# Patient Record
Sex: Female | Born: 1955 | Race: White | Hispanic: No | Marital: Married | Smoking: Never smoker
Health system: Southern US, Community
[De-identification: ages and names within clinical notes are randomized; demographics above are authoritative.]

## PROBLEM LIST (undated history)

## (undated) DIAGNOSIS — R87619 Unspecified abnormal cytological findings in specimens from cervix uteri: Secondary | ICD-10-CM

## (undated) DIAGNOSIS — N3010 Interstitial cystitis (chronic) without hematuria: Secondary | ICD-10-CM

## (undated) DIAGNOSIS — N816 Rectocele: Secondary | ICD-10-CM

## (undated) DIAGNOSIS — IMO0002 Reserved for concepts with insufficient information to code with codable children: Secondary | ICD-10-CM

## (undated) DIAGNOSIS — N6019 Diffuse cystic mastopathy of unspecified breast: Secondary | ICD-10-CM

## (undated) DIAGNOSIS — B1920 Unspecified viral hepatitis C without hepatic coma: Secondary | ICD-10-CM

## (undated) DIAGNOSIS — G8929 Other chronic pain: Secondary | ICD-10-CM

## (undated) DIAGNOSIS — R011 Cardiac murmur, unspecified: Secondary | ICD-10-CM

## (undated) DIAGNOSIS — Z79899 Other long term (current) drug therapy: Secondary | ICD-10-CM

## (undated) DIAGNOSIS — G4700 Insomnia, unspecified: Secondary | ICD-10-CM

## (undated) HISTORY — PX: KNEE ARTHROSCOPY: SUR90

## (undated) HISTORY — DX: Other long term (current) drug therapy: Z79.899

## (undated) HISTORY — PX: NASAL POLYP SURGERY: SHX186

## (undated) HISTORY — DX: Cardiac murmur, unspecified: R01.1

## (undated) HISTORY — DX: Other chronic pain: G89.29

## (undated) HISTORY — PX: NASAL SEPTUM SURGERY: SHX37

## (undated) HISTORY — PX: BREAST ENHANCEMENT SURGERY: SHX7

## (undated) HISTORY — DX: Unspecified viral hepatitis C without hepatic coma: B19.20

## (undated) HISTORY — DX: Interstitial cystitis (chronic) without hematuria: N30.10

## (undated) HISTORY — DX: Reserved for concepts with insufficient information to code with codable children: IMO0002

## (undated) HISTORY — DX: Unspecified abnormal cytological findings in specimens from cervix uteri: R87.619

## (undated) HISTORY — PX: OTHER SURGICAL HISTORY: SHX169

## (undated) HISTORY — PX: ABDOMINOPLASTY: SUR9

## (undated) HISTORY — PX: CYSTOSCOPY: SUR368

## (undated) HISTORY — DX: Insomnia, unspecified: G47.00

## (undated) HISTORY — PX: ROTATOR CUFF REPAIR: SHX139

## (undated) HISTORY — DX: Rectocele: N81.6

## (undated) HISTORY — DX: Diffuse cystic mastopathy of unspecified breast: N60.19

---
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
## 1991-04-18 HISTORY — PX: OTHER SURGICAL HISTORY: SHX169

## 1998-11-23 ENCOUNTER — Other Ambulatory Visit: Admission: RE | Admit: 1998-11-23 | Discharge: 1998-11-23 | Payer: Self-pay | Admitting: *Deleted

## 2000-04-12 ENCOUNTER — Other Ambulatory Visit: Admission: RE | Admit: 2000-04-12 | Discharge: 2000-04-12 | Payer: Self-pay | Admitting: *Deleted

## 2001-02-02 ENCOUNTER — Other Ambulatory Visit: Admission: RE | Admit: 2001-02-02 | Discharge: 2001-02-02 | Payer: Self-pay | Admitting: *Deleted

## 2001-02-27 ENCOUNTER — Encounter: Payer: Self-pay | Admitting: Family Medicine

## 2001-02-27 ENCOUNTER — Encounter: Admission: RE | Admit: 2001-02-27 | Discharge: 2001-02-27 | Payer: Self-pay | Admitting: Family Medicine

## 2002-02-18 ENCOUNTER — Other Ambulatory Visit: Admission: RE | Admit: 2002-02-18 | Discharge: 2002-02-18 | Payer: Self-pay | Admitting: *Deleted

## 2003-03-13 ENCOUNTER — Other Ambulatory Visit: Admission: RE | Admit: 2003-03-13 | Discharge: 2003-03-13 | Payer: Self-pay | Admitting: *Deleted

## 2003-05-09 ENCOUNTER — Encounter: Payer: Self-pay | Admitting: Family Medicine

## 2003-05-09 ENCOUNTER — Encounter: Admission: RE | Admit: 2003-05-09 | Discharge: 2003-05-09 | Payer: Self-pay | Admitting: Family Medicine

## 2003-07-01 ENCOUNTER — Encounter: Admission: RE | Admit: 2003-07-01 | Discharge: 2003-07-01 | Payer: Self-pay | Admitting: Family Medicine

## 2003-11-04 ENCOUNTER — Ambulatory Visit (HOSPITAL_COMMUNITY): Admission: RE | Admit: 2003-11-04 | Discharge: 2003-11-04 | Payer: Self-pay | Admitting: Urology

## 2003-11-04 ENCOUNTER — Ambulatory Visit (HOSPITAL_BASED_OUTPATIENT_CLINIC_OR_DEPARTMENT_OTHER): Admission: RE | Admit: 2003-11-04 | Discharge: 2003-11-04 | Payer: Self-pay | Admitting: Urology

## 2004-03-15 ENCOUNTER — Other Ambulatory Visit: Admission: RE | Admit: 2004-03-15 | Discharge: 2004-03-15 | Payer: Self-pay | Admitting: *Deleted

## 2004-07-09 ENCOUNTER — Ambulatory Visit (HOSPITAL_BASED_OUTPATIENT_CLINIC_OR_DEPARTMENT_OTHER): Admission: RE | Admit: 2004-07-09 | Discharge: 2004-07-09 | Payer: Self-pay | Admitting: Orthopedic Surgery

## 2005-03-24 ENCOUNTER — Other Ambulatory Visit: Admission: RE | Admit: 2005-03-24 | Discharge: 2005-03-24 | Payer: Self-pay | Admitting: *Deleted

## 2006-03-28 ENCOUNTER — Other Ambulatory Visit: Admission: RE | Admit: 2006-03-28 | Discharge: 2006-03-28 | Payer: Self-pay | Admitting: Obstetrics & Gynecology

## 2006-07-25 ENCOUNTER — Ambulatory Visit (HOSPITAL_BASED_OUTPATIENT_CLINIC_OR_DEPARTMENT_OTHER): Admission: RE | Admit: 2006-07-25 | Discharge: 2006-07-25 | Payer: Self-pay | Admitting: Urology

## 2007-06-06 ENCOUNTER — Encounter: Admission: RE | Admit: 2007-06-06 | Discharge: 2007-06-06 | Payer: Self-pay | Admitting: Family Medicine

## 2007-08-16 ENCOUNTER — Encounter: Admission: RE | Admit: 2007-08-16 | Discharge: 2007-08-16 | Payer: Self-pay | Admitting: Family Medicine

## 2007-09-03 ENCOUNTER — Encounter: Admission: RE | Admit: 2007-09-03 | Discharge: 2007-09-03 | Payer: Self-pay | Admitting: Neurological Surgery

## 2007-09-03 ENCOUNTER — Other Ambulatory Visit: Admission: RE | Admit: 2007-09-03 | Discharge: 2007-09-03 | Payer: Self-pay | Admitting: Obstetrics and Gynecology

## 2007-12-25 ENCOUNTER — Encounter: Admission: RE | Admit: 2007-12-25 | Discharge: 2007-12-25 | Payer: Self-pay | Admitting: Neurological Surgery

## 2007-12-28 ENCOUNTER — Emergency Department (HOSPITAL_BASED_OUTPATIENT_CLINIC_OR_DEPARTMENT_OTHER): Admission: EM | Admit: 2007-12-28 | Discharge: 2007-12-28 | Payer: Self-pay | Admitting: Emergency Medicine

## 2008-09-04 ENCOUNTER — Other Ambulatory Visit: Admission: RE | Admit: 2008-09-04 | Discharge: 2008-09-04 | Payer: Self-pay | Admitting: Obstetrics & Gynecology

## 2008-10-15 ENCOUNTER — Emergency Department (HOSPITAL_BASED_OUTPATIENT_CLINIC_OR_DEPARTMENT_OTHER): Admission: EM | Admit: 2008-10-15 | Discharge: 2008-10-15 | Payer: Self-pay | Admitting: Emergency Medicine

## 2009-04-01 ENCOUNTER — Emergency Department (HOSPITAL_BASED_OUTPATIENT_CLINIC_OR_DEPARTMENT_OTHER): Admission: EM | Admit: 2009-04-01 | Discharge: 2009-04-01 | Payer: Self-pay | Admitting: Emergency Medicine

## 2010-03-18 HISTORY — PX: FRACTURE SURGERY: SHX138

## 2010-12-03 NOTE — Op Note (Signed)
NAME:  Alicia Fernandez, Alicia Fernandez                      ACCOUNT NO.:  [id number]   MEDICAL RECORD NO.:  [id number]                   PATIENT TYPE:  AMB   LOCATION:  [facility]                                 FACILITY:  [facility]   PHYSICIAN:  Jamison Neighbor, M.D.               DATE OF BIRTH:  1955/07/30   DATE OF PROCEDURE:  11/04/2003  DATE OF DISCHARGE:                                 OPERATIVE REPORT   PREOPERATIVE DIAGNOSIS:  Chronic pelvic pain, interstitial cystitis.   POSTOPERATIVE DIAGNOSIS:  Chronic pelvic pain, interstitial cystitis.   PROCEDURE:  Cystoscopy, urethral calibration, hydrodistention of bladder  marking and __________ Marcaine and Kenalog injection.   SURGEON:  Jamison Neighbor, M.D.   ANESTHESIA:  General.   COMPLICATIONS:  None.   DRAINS:  None.   BRIEF HISTORY:  This 55 year old female has had longstanding problems with  lower urinary tract symptomatology, including urgency, frequency, and pelvic  pain.  She has also exhibited evidence of dysfunctional voiding.  The  patient has been treated in the past with interstitial cystitis-type  therapy, but her therapy has been complicated by the fact that she is known  to have hepatitis and cannot take medications that effect her liver.  The  patient has currently been on Flomax and Valium along with some Pyridium  Plus and has also done DMSO bladder installation.  She has developed  increasing problems recently.  It is unclear if this is due to stress or due  to seasonal allergies, but she is now to the point where she is straining to  urinate, struggling to empty, and has had a significant increase in pain.  The patient was given several options to therapy, including a change in her  insulation therapy program, and she has elected to undergo cystoscopy with  hydrodistention.  She understands the risks and benefits of the procedure.  She simply understands that there is no guarantee that she will have the  same response to  hydrodistention that she has had in the past.  When she had  this done several years ago, she had good long-term therapy, and it is our  hope that this will allow her to be functional over the next 2-3 months when  her son is graduating from college and her daughter is getting married.   PROCEDURE:  After successful induction of general anesthesia, the patient  was placed in the lateral decubitus position and prepped with Betadine and  draped in the usual sterile fashion.  Careful bimanual examination was  unremarkable.  There were no urethral masses.  There were no adnexal masses.  There was no discharge of any kind.  There was modest cystocele.  No  significant rectocele.  No vault prolapse.  The urethra was calibrated at [address]  [place] with no evidence of stenosis or stricture.  The cystoscope was  inserted.  The bladder was carefully inspected.  It was free of any tumors  or stones.  Both ureteral orifices were normal in configuration and  location.  Clear urine was seen to efflux from each.  The patient underwent  hydrodistention at a pressure of 100 cm of water for 5 minutes.  When the  bladder was drained, the patient was found to have an essentially normal  bladder capacity of 1150 cc.  Modest glomerulations could be seen but  certainly not widespread bleeding or Hunter's ulcer formation.  If her  previous  history was not well known, this would appear to be an almost-normal  bladder.  There was nothing that required biopsy.  Marcaine and Pyridium  were left in the bladder.  Marcaine and Kenalog were injected  periurethrally.  The patient tolerated the procedure well and was taken to  the recovery room in good condition.                                               Jamison Neighbor, M.D.    RJE/MEDQ  D:  11/04/2003  T:  11/04/2003  Job:  [id number]   cc:   Donia Guiles, M.D.  [address] [place]  [place] [address]  Fax: [phone]

## 2010-12-03 NOTE — Op Note (Signed)
Alicia Fernandez, Alicia Fernandez            ACCOUNT NO.:  [id number]   MEDICAL RECORD NO.:  [id number]          PATIENT TYPE:  AMB   LOCATION:  [facility]                          FACILITY:  [facility]   PHYSICIAN:  Harvie Junior, M.D.   DATE OF BIRTH:  [birth year]   DATE OF PROCEDURE:  DATE OF DISCHARGE:                                 OPERATIVE REPORT   DATE OF OPERATION:  July 09, 2004.   PREOPERATIVE DIAGNOSES:  1.  Impingement, right shoulder.  2.  AC joint arthritis, right shoulder.  3.  Rotator cuff tear, right shoulder.   POSTOPERATIVE DIAGNOSES:  1.  Impingement, right shoulder.  2.  AC joint arthritis, right shoulder.  3.  Rotator cuff tear, right shoulder.   PRINCIPAL PROCEDURE:  1.  Mini open rotator cuff repair.  2.  Arthroscopic acromioplasty.  3.  Arthroscopic distal clavicle resection.   SURGEON:  Harvie Junior, MD.   ASSISTANT:  Marshia Ly, PA.   ANESTHESIA:  General.   BRIEF HISTORY:  A 55 year old female with a long history of having right  shoulder pain.  She was evaluated and underwent injection therapy, although  has continued complaints of pain.  MRI showed a high-grade partial tear.  We  talked about treatment options and felt that arthroscopic evaluation was the  most appropriate course of action, and she is brought to the operating room  for this procedure.   PROCEDURE:  The patient is brought to the operating room and after adequate  anesthesia was obtained with general anesthetic, the patient was placed into  the beach-chair position.  All bony prominences were well padded.  A  preoperative interscalene block had been performed.  Attention was turned to  the right shoulder, which was prepped and draped in the usual sterile  fashion.  Following this, routine arthroscopic examination of the shoulder  revealed that there was an obvious undersurface significant tear in the  glenohumeral joint.  This was debrided with the suction shaver, and a full  thickness tear was obvious.  At this point, attention was turned to the  superior labrum.  There was a little posterior, superior labral tear, which  was debrided.  There was a central area of osteocondylar defect within the  glenohumeral joint, which was debrided.  At this point, attention was turned  out of the glenohumeral joint and up into the subacromial space.  An  anterolateral acromioplasty was performed from the lateral and posterior  compartments, and a distal clavicle resection of 17 mm was performed at the  anterior compartment.  Attention was then turned towards the rotator cuff  where the obvious rotator cuff tear was identified.  At this point, the  arthroscopic portion of the case was stopped, a small incision made over the  lateral aspect of the shoulder and subcutaneous tissues down to the deltoid.  The deltoid was divided in line with its fibers, and the rotator cuff tear  was identified and debrided.  The bone was then freshened, and a single 5 mm  suture anchor was used with two #2 FiberWire sutures, and the rotator cuff  was repaired to this central area.  At this point, the shoulder was  copiously irrigated and suctioned dry.  A check was made for any tendency  towards impingement.  Finding none, the shoulder was copiously irrigated and  suctioned dry, and the deltoid was then closed with a #1 Vicryl running  suture, the skin with 3-0 Vicryl and a 4-0 Maxon pull-out suture.  Benzoin  and Steri-Strips were applied.  The portals were closed with a bandage.  A  sterile, compressive dressing was applied.  The patient was taken to the  recovery room where she was noted to be in satisfactory condition.  Estimated blood loss for the procedure was none.      Junita Push  D:  07/09/2004  T:  07/10/2004  Job:  [id number]

## 2010-12-03 NOTE — Op Note (Signed)
NAMEDAWNN, NAM            ACCOUNT NO.:  [id number]   MEDICAL RECORD NO.:  [id number]          PATIENT TYPE:  AMB   LOCATION:  [facility]                         [facility]ACILITY:  [facility]   PHYSICIAN:  Jamison Neighbor, M.D.  DATE OF BIRTH:  1955/12/20   DATE OF PROCEDURE:  07/25/2006  DATE OF DISCHARGE:                               OPERATIVE REPORT   PREOPERATIVE DIAGNOSIS:  Interstitial cystitis.   POSTOPERATIVE DIAGNOSIS:  Interstitial cystitis.   PROCEDURE:  Cystoscopy, urethral calibration, hydrodistention of the  bladder, Marcaine and Pyridium installation, Marcaine and Kenalog  injection.   SURGEON:  Dr. Marcelyn Bruins.   ANESTHESIA:  General.   COMPLICATIONS:  None.   DRAINS:  None.   BRIEF HISTORY:  This 55 year old female has a history of voiding  dysfunction secondary to pelvic floor problems.  She has also had  problems with pelvic pain responsive to hydrodistention.  The patient  has a relatively unremarkable bladder but seems to respond quite well to  hydrodistention.  She has requested a repeat hydrodistention be  performed.  She understands the risks and benefits of the procedure and  gave full informed consent.   DESCRIPTION OF PROCEDURE:  After successful induction of general  anesthesia, the patient was placed in the dorsal position, prepped with  Betadine and draped in the usual sterile fashion.  Careful bimanual  examination showed a modest cystocele, no vault prolapse, normal urethra  and no significant rectocele.  The urethra was calibrated to 30-[place]  with female urethral sounds with no evidence of stenosis or stricture.  The cystoscope was inserted, the bladder was carefully inspected and was  free of any tumor or stones.  Both ureteral orifices were normal in  configuration and location.  Hydrodistention of the bladder was  performed, the bladder was distended at a pressure of 100 cmH2O for 5  minutes. When the bladder was drained, the patient had  minimal  glomerulations and a normal bladder capacity of 1400 mL indicating her  bladder was in much better shape than it has been in the past. The  bladder was drained and a mixture of Marcaine and Pyridium was left in  the bladder, Marcaine and Kenalog  were injected periurethrally.  The patient tolerated the procedure well  and was taken to the recovery room in good condition.  She will continue  with her pelvic floor therapy.  She will have Cipro to take for  infections when needed and she will continue on pain management.           ______________________________  Jamison Neighbor, M.D.  Electronically Signed     RJE/MEDQ  D:  07/25/2006  T:  07/25/2006  Job:  [id number]

## 2012-02-16 DIAGNOSIS — B182 Chronic viral hepatitis C: Secondary | ICD-10-CM | POA: Insufficient documentation

## 2012-05-16 ENCOUNTER — Ambulatory Visit (HOSPITAL_COMMUNITY): Payer: Self-pay | Admitting: Psychology

## 2012-05-17 ENCOUNTER — Encounter (HOSPITAL_COMMUNITY): Payer: Self-pay | Admitting: Psychology

## 2012-05-29 ENCOUNTER — Ambulatory Visit (INDEPENDENT_AMBULATORY_CARE_PROVIDER_SITE_OTHER): Payer: 59 | Admitting: Psychology

## 2012-05-29 DIAGNOSIS — F4323 Adjustment disorder with mixed anxiety and depressed mood: Secondary | ICD-10-CM

## 2012-05-29 DIAGNOSIS — F4329 Adjustment disorder with other symptoms: Secondary | ICD-10-CM
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
## 2012-06-06 ENCOUNTER — Encounter (HOSPITAL_COMMUNITY): Payer: Self-pay | Admitting: Psychology

## 2012-06-06 ENCOUNTER — Ambulatory Visit (INDEPENDENT_AMBULATORY_CARE_PROVIDER_SITE_OTHER): Payer: 59 | Admitting: Psychology

## 2012-06-06 DIAGNOSIS — F4329 Adjustment disorder with other symptoms: Secondary | ICD-10-CM

## 2012-06-06 NOTE — Patient Instructions (Addendum)
1- Dr. Damien Fusi is opening a new practice located in [place] at the [facility] location on [address].
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
## 2012-06-07 ENCOUNTER — Encounter (HOSPITAL_COMMUNITY): Payer: Self-pay | Admitting: Psychology

## 2012-06-07 DIAGNOSIS — F411 Generalized anxiety disorder: Secondary | ICD-10-CM | POA: Insufficient documentation

## 2012-06-09 ENCOUNTER — Encounter (HOSPITAL_COMMUNITY): Payer: Self-pay | Admitting: Psychology

## 2012-06-25 ENCOUNTER — Ambulatory Visit (HOSPITAL_COMMUNITY): Payer: Self-pay | Admitting: Psychology

## 2012-06-27 ENCOUNTER — Ambulatory Visit (INDEPENDENT_AMBULATORY_CARE_PROVIDER_SITE_OTHER): Payer: 59 | Admitting: Psychology

## 2012-06-27 DIAGNOSIS — F4329 Adjustment disorder with other symptoms: Secondary | ICD-10-CM

## 2012-06-27 NOTE — Progress Notes (Signed)
   THERAPIST PROGRESS NOTE  Session Time: 118- 208 pm  Participation Level: Active  Behavioral Response: CasualAlertSad and Tearful  Type of Therapy: Individual Therapy  Treatment Goals addressed: Coping  Interventions: Solution Focused, Strength-based, Psychosocial Skills: coping, dealing with rejection, betrayal and Supportive  Summary: Alicia Fernandez is a 56 y.o. female who presents as tired and upset but pleasant.  She reports she had a very bad night last night and didn't get to sleep until very late and woke up late.  She explained that her best friend/neighbor had called to leave a voice mail but had not hanged up the phone and the voice mail recorded her talking about her in a very derrogetory way and this was very hurtful.  She cried throughout the session as she talked about how this woman has hurt her before and she has always forgiven her.  I provided support to the patient throughout the session as she processed her thoughts and feelings. We talked about how she might handle herself if and when she runs into her friend or when this friend approaches her as she has done in the past when she has engaged this behavior.  Suicidal/Homicidal: No  Plan: Return again in 1 week.  Diagnosis: Axis I: Adjustment Disorder with Mixed Emotional Features    Axis II: Deferred    Salley Scarlet, [facility] 06/27/2012

## 2012-06-28 ENCOUNTER — Encounter (HOSPITAL_COMMUNITY): Payer: Self-pay | Admitting: Psychology

## 2012-07-04 ENCOUNTER — Ambulatory Visit (INDEPENDENT_AMBULATORY_CARE_PROVIDER_SITE_OTHER): Payer: 59 | Admitting: Psychology

## 2012-07-04 ENCOUNTER — Encounter (HOSPITAL_COMMUNITY): Payer: Self-pay | Admitting: Psychology

## 2012-07-04 DIAGNOSIS — F4329 Adjustment disorder with other symptoms: Secondary | ICD-10-CM
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
## 2012-07-16 ENCOUNTER — Ambulatory Visit (HOSPITAL_COMMUNITY): Payer: Self-pay | Admitting: Psychology

## 2012-07-16 DIAGNOSIS — B1920 Unspecified viral hepatitis C without hepatic coma: Secondary | ICD-10-CM | POA: Insufficient documentation

## 2012-07-16 DIAGNOSIS — S060X9A Concussion with loss of consciousness of unspecified duration, initial encounter: Secondary | ICD-10-CM | POA: Insufficient documentation

## 2012-07-16 DIAGNOSIS — N3010 Interstitial cystitis (chronic) without hematuria: Secondary | ICD-10-CM | POA: Insufficient documentation

## 2012-07-16 DIAGNOSIS — F339 Major depressive disorder, recurrent, unspecified: Secondary | ICD-10-CM | POA: Insufficient documentation

## 2012-07-26 ENCOUNTER — Ambulatory Visit (INDEPENDENT_AMBULATORY_CARE_PROVIDER_SITE_OTHER): Payer: 59 | Admitting: Psychology

## 2012-07-26 DIAGNOSIS — F411 Generalized anxiety disorder: Secondary | ICD-10-CM

## 2012-07-27 ENCOUNTER — Encounter (HOSPITAL_COMMUNITY): Payer: Self-pay | Admitting: Psychology

## 2012-07-27 NOTE — Progress Notes (Signed)
   THERAPIST PROGRESS NOTE  Session Time: 216-300 pm  Participation Level: Active  Behavioral Response: CasualAlertEuthymic  Type of Therapy: Individual Therapy  Treatment Goals addressed: Communication: thoughts and feelings, in relationships and Coping  Interventions: Solution Focused, Strength-based, Psychosocial Skills: coping, communication and Supportive  Summary: Alicia Fernandez is a 57 y.o. female who presents 15 minutes late for her appointment. I confronted the patient on having been late for every appointment and informed her that this was unacceptable. The patient shared that a lengthy story of how her pain doctor had removed her from half of her pain medication when she requested only to be cut back a little on her medications. The patient reports that when she asked for the medication to be lowered the MD stated that it was not the right time because it was winter and the patient questioned her and asked why couldn't it be lowered. The patient believes that the MD was sending her a clear message that she was the MD and that she is not to question her. The patient reports that she was prepared to go through the withdrawal but discussed the amount of pain that she was experiencing at various point in her body. She reports the desire to cut back on her medication to please her children who believe she has an addiction.  Apparently, her husband was unwilling to see her suffering and without her knowledge went to the patient's MD this morning and spoke to her and the MD agreed to return her to her previous dosage.  The patient received a letter from her neighbor/(former)best friend who recently hurt her deeply by talking about her and she wishes she had never written. The patient reports she wishes she would have remembered to bring it because she wanted to go through each part of the letter. It appears that the lady was apologizing for her behavior; the patient's take was that at the  same time she was criticizing the patient and calling her out on things she has done over the years as well and telling her that they both needed a break from the friendship. The patient is still deeply wounded by the words she overheard this woman speak on the phone.  I talked with the patient about how some friendships exist like sister relationships when people grow up together and competition exists, perhaps not for both people but maybe for the one person. The patient thinks this was very much the case for [name]. She feels that Alicia Fernandez saw her as competition. I suggested that Alicia Fernandez likely had her own issues that needed to be dealt with but never were and that she became entangled in the issues. She may never get resolution but she needed to make some decisions for herself about how she will handle the relationship in the future should Alicia Fernandez ever want to resume contact.  I reminded the patient that this was our last visit and that Alicia Fernandez would be taking over at her next visit.   Suicidal/Homicidal: No  Plan: Return again in 2 weeks for her first appointment with Alicia Morse, LCSW.  Diagnosis: Axis I: Generalized Anxiety Disorder    Axis II: Deferred    Alicia Fernandez, [facility] 07/27/2012

## 2012-08-09 ENCOUNTER — Ambulatory Visit (INDEPENDENT_AMBULATORY_CARE_PROVIDER_SITE_OTHER): Payer: 59 | Admitting: Licensed Clinical Social Worker

## 2012-08-09 DIAGNOSIS — F411 Generalized anxiety disorder: Secondary | ICD-10-CM
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
## 2012-09-04 ENCOUNTER — Ambulatory Visit (INDEPENDENT_AMBULATORY_CARE_PROVIDER_SITE_OTHER): Payer: 59 | Admitting: Licensed Clinical Social Worker

## 2012-09-04 DIAGNOSIS — F411 Generalized anxiety disorder: Secondary | ICD-10-CM

## 2012-09-11 ENCOUNTER — Ambulatory Visit (HOSPITAL_COMMUNITY): Payer: Self-pay | Admitting: Licensed Clinical Social Worker
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
## 2012-09-14 DIAGNOSIS — K5902 Outlet dysfunction constipation: Secondary | ICD-10-CM | POA: Insufficient documentation

## 2012-09-20 ENCOUNTER — Ambulatory Visit (INDEPENDENT_AMBULATORY_CARE_PROVIDER_SITE_OTHER): Payer: 59 | Admitting: Licensed Clinical Social Worker

## 2012-10-16 HISTORY — PX: VAGINAL HYSTERECTOMY: SUR661

## 2012-10-16 HISTORY — PX: VAGINAL PROLAPSE REPAIR: SHX830

## 2012-10-18 ENCOUNTER — Ambulatory Visit (HOSPITAL_COMMUNITY): Payer: Self-pay | Admitting: Licensed Clinical Social Worker

## 2012-10-22 ENCOUNTER — Other Ambulatory Visit: Payer: Self-pay | Admitting: Gastroenterology

## 2012-10-22 DIAGNOSIS — K759 Inflammatory liver disease, unspecified: Secondary | ICD-10-CM

## 2012-10-26 ENCOUNTER — Ambulatory Visit
Admission: RE | Admit: 2012-10-26 | Discharge: 2012-10-26 | Disposition: A | Discharge status: Home / Self Care | Payer: 59 | Source: Ambulatory Visit | Attending: Gastroenterology | Admitting: Gastroenterology

## 2012-10-26 ENCOUNTER — Other Ambulatory Visit: Payer: Self-pay

## 2012-10-26 DIAGNOSIS — K759 Inflammatory liver disease, unspecified: Secondary | ICD-10-CM

## 2012-10-30 ENCOUNTER — Ambulatory Visit (HOSPITAL_COMMUNITY): Payer: Self-pay | Admitting: Licensed Clinical Social Worker

## 2012-11-15 ENCOUNTER — Ambulatory Visit (HOSPITAL_COMMUNITY): Payer: Self-pay | Admitting: Licensed Clinical Social Worker

## 2012-12-12 ENCOUNTER — Ambulatory Visit (HOSPITAL_COMMUNITY): Payer: Self-pay | Admitting: Licensed Clinical Social Worker

## 2012-12-26 ENCOUNTER — Ambulatory Visit (HOSPITAL_COMMUNITY): Payer: Self-pay | Admitting: Licensed Clinical Social Worker

## 2013-01-28 ENCOUNTER — Other Ambulatory Visit: Payer: Self-pay | Admitting: Family Medicine

## 2013-01-28 DIAGNOSIS — R220 Localized swelling, mass and lump, head: Secondary | ICD-10-CM

## 2013-01-28 DIAGNOSIS — R221 Localized swelling, mass and lump, neck: Secondary | ICD-10-CM

## 2013-01-29 ENCOUNTER — Ambulatory Visit
Admission: RE | Admit: 2013-01-29 | Discharge: 2013-01-29 | Disposition: A | Discharge status: Home / Self Care | Payer: 59 | Source: Ambulatory Visit | Attending: Family Medicine | Admitting: Family Medicine

## 2013-01-29 ENCOUNTER — Other Ambulatory Visit: Payer: Self-pay | Admitting: Family Medicine

## 2013-01-29 DIAGNOSIS — R220 Localized swelling, mass and lump, head: Secondary | ICD-10-CM

## 2013-01-29 DIAGNOSIS — R634 Abnormal weight loss: Secondary | ICD-10-CM

## 2013-01-30 ENCOUNTER — Other Ambulatory Visit: Payer: Self-pay

## 2013-02-04 ENCOUNTER — Other Ambulatory Visit: Payer: Self-pay | Admitting: Family Medicine

## 2013-02-04 DIAGNOSIS — R599 Enlarged lymph nodes, unspecified: Secondary | ICD-10-CM

## 2013-02-07 ENCOUNTER — Other Ambulatory Visit: Payer: Self-pay

## 2013-02-08 ENCOUNTER — Ambulatory Visit
Admission: RE | Admit: 2013-02-08 | Discharge: 2013-02-08 | Disposition: A | Discharge status: Home / Self Care | Payer: 59 | Source: Ambulatory Visit | Attending: Family Medicine | Admitting: Family Medicine

## 2013-02-08 DIAGNOSIS — R599 Enlarged lymph nodes, unspecified: Secondary | ICD-10-CM

## 2013-02-08 MED ORDER — IOHEXOL 300 MG/ML  SOLN
75.0000 mL | Freq: Once | INTRAMUSCULAR | Status: AC | PRN
  Administered 2013-02-08: 75 mL via INTRAVENOUS

## 2013-04-11 ENCOUNTER — Encounter: Payer: Self-pay | Admitting: Obstetrics & Gynecology

## 2013-07-31 ENCOUNTER — Ambulatory Visit: Payer: Self-pay | Admitting: Obstetrics & Gynecology

## 2013-08-02 ENCOUNTER — Ambulatory Visit: Payer: Self-pay | Admitting: Obstetrics & Gynecology

## 2013-09-02 ENCOUNTER — Encounter: Payer: Self-pay | Admitting: Obstetrics & Gynecology

## 2013-09-03 ENCOUNTER — Encounter: Payer: Self-pay | Admitting: Obstetrics & Gynecology

## 2013-09-03 ENCOUNTER — Ambulatory Visit: Payer: Self-pay | Admitting: Obstetrics & Gynecology

## 2013-10-14 ENCOUNTER — Encounter: Payer: Self-pay | Admitting: Obstetrics & Gynecology

## 2013-10-14 ENCOUNTER — Ambulatory Visit (INDEPENDENT_AMBULATORY_CARE_PROVIDER_SITE_OTHER): Payer: 59 | Admitting: Obstetrics & Gynecology

## 2013-10-14 VITALS — BP 120/80 | HR 64 | Resp 16 | Ht 60.25 in | Wt 122.6 lb

## 2013-10-14 DIAGNOSIS — Z01419 Encounter for gynecological examination (general) (routine) without abnormal findings: Secondary | ICD-10-CM

## 2013-10-14 DIAGNOSIS — Z0000 Encounter for general adult medical examination without abnormal findings: Secondary | ICD-10-CM

## 2013-10-14 LAB — POCT URINALYSIS DIPSTICK
Bilirubin, UA: NEGATIVE
Glucose, UA: NEGATIVE
Ketones, UA: NEGATIVE
NITRITE UA: NEGATIVE
PROTEIN UA: NEGATIVE
RBC UA: NEGATIVE
UROBILINOGEN UA: NEGATIVE
pH, UA: 5
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
## 2013-10-25 ENCOUNTER — Telehealth: Payer: Self-pay | Admitting: *Deleted

## 2013-10-25 NOTE — Telephone Encounter (Addendum)
Patient calling stating that she had her AEX with Dr. Hyacinth MeekerMiller 10/14/13 and they discussed her rectocele that a previous Doctor was repairing. Patient would like Dr. Hyacinth MeekerMiller to write a letter regarding her rectocele not being completely corrected. Patient is going to see Dr. Lavella Hammockatherine Matthews on the 30th of April and would like it if she had a letter from Dr. Hyacinth MeekerMiller stating what they talked about her rectocele and how it wasn't fully repaired.  Told patient Dr. Hyacinth MeekerMiller is seeing patients right now and that she probably won't hear anything until next week sometime, patient verbalized understanding and aware just would like someone to call her back when it's done.  Please advise.

## 2013-10-30 MED ORDER — ESTROGENS, CONJUGATED 0.625 MG/GM VA CREA: TOPICAL_CREAM | VAGINAL | Status: DC

## 2013-10-30 MED ORDER — BUPROPION HCL ER (XL) 300 MG PO TB24: 300.0000 mg | ORAL_TABLET | Freq: Every day | ORAL | Status: DC

## 2013-10-30 NOTE — Telephone Encounter (Signed)
Alicia Fernandez,  Can you call this pt.  [facility] for mild uterine prolapse and rectocele.  Pt wants me to write a note to Dr. Ashley RoyaltyMatthews (the surgeon she saw)  to explain to her that the surgery was not done completely.  Dr. Ashley RoyaltyMatthews is an expert in this field and I do not feel that is appropriate.  I am happy to send her a copy of my note from her visit and also make a referral to someone else if she desires.  There are four urogynecologist (like Dr. Ashley [facility]Matthews) [facility] (that are [facility]) and we can always refer her there for a second opinion.   CC: [name] as she took the original message from pt.

## 2013-10-30 NOTE — Telephone Encounter (Signed)
Message left to return call to Alizey Noren at [phone].    
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
## 2013-10-31 NOTE — Telephone Encounter (Addendum)
Late Entry for 10/31/13 at 1115: Patient returned call. Message from Dr. Hyacinth MeekerMiller given. Patient declines referral at this time. She states she will contact Dr. Ashley RoyaltyMatthews office to see if she can discuss with Dr. Ashley RoyaltyMatthews over the phone.  Patient states she saw her provider Dr. Marcelyn Bruinsobert Evans, Urologist, and discussed her ongoing concerns with rectocele, states she had a bladder scan yesterday and still having 150 ml of post void residual and still suffering from ongoing urinary tract issues. Patient feels that she is having ongoing issues from surgery and still continues with urinary infections. Patient is asking if Dr. Hyacinth MeekerMiller thinks she should follow up with Dr. Ashley RoyaltyMatthews. I reviewed Dr. Garen LahMillers note with her and per Dr. Garen LahMillers note, Dr. Hyacinth MeekerMiller thinks patient should discuss these issues with Dr. Ashley RoyaltyMatthews. Patient would like a copy of Dr. Garen LahMillers note mailed to her. I advised I would send a copy directly to Dr. Ashley RoyaltyMatthews but patient would like a copy as well. Discussed with Dr. Hyacinth MeekerMiller, Dr. Hyacinth MeekerMiller aware patient requested records. Patient will have to come to office to sign release. Copy of progress notes from 10/14/13 with Dr. Hyacinth MeekerMiller, placed at reception with medical records release form. Dr. Hyacinth MeekerMiller aware.  14 minute phone call with patient and reiterated message from Dr. Hyacinth MeekerMiller and continued to advise that Dr. Hyacinth MeekerMiller suggested follow up with Dr. Ashley RoyaltyMatthews.   Routing to provider for final review. Patient agreeable to disposition. Will close encounter

## 2013-11-28 ENCOUNTER — Telehealth: Payer: Self-pay | Admitting: Obstetrics & Gynecology

## 2013-11-28 DIAGNOSIS — N816 Rectocele: Secondary | ICD-10-CM

## 2013-11-28 NOTE — Telephone Encounter (Signed)
Spoke with patient. She states that she saw [facility]. [facility] at [facility] for an appointment on 11/14/13 at 1440 and was evaluated for what patient describes as continued rectocele, states "I still have it." She states that she had [facility], patient could not tell me what kind of imaging was done, patient describes at as " a something gram, I am not sure". She states that her imaging showed a "very big rectocele, its large and blocking my urethra." Patient states this was to be repaired by Dr. Ashley Fernandez and states "I never got an answer as to why it wasn't repaired." I advised that both Dr. Hyacinth Fernandez and I could not see notes from either Dr. Ashley Fernandez or her recent imaging, but that since it was so new it may not show up in the electronic records. Patient states she has a CD copy and would like Dr. Hyacinth Fernandez to review the images and refer her to another gyn surgeon who can repair the rectocele, she specifically [facility] since she lives in [place]. Patient states Dr. Jerolyn Fernandez is [facility] October states that Dr. Ashley Fernandez states she could wait until October for another surgery but patient feels she cannot wait.   Patient wants to drop off CD of images today. I advised that Dr. Hyacinth Fernandez would review the images if she could (unsure about CD and if we can use the program or view images) but could not give a set time.  Patient also states that she will pick up records from last month when she requested copies of Dr. Garen Fernandez notes. I confirmed that they are still at front desk and release is attached.   Routing to Dr. Hyacinth Fernandez to review and advise for referral.

## 2013-11-28 NOTE — Telephone Encounter (Signed)
Pt says she is not able to bring disk of her images to office today. Says Dr Hyacinth MeekerMiller can request the images sent to her by sending a medical release to [facility]. Their number is [phone] and fax number is [phone].

## 2013-11-28 NOTE — Telephone Encounter (Signed)
Patient had appointment with Dr.Matthew's. (referred from Dr. Hyacinth MeekerMiller) Patient would like Dr.Miller to look at images from Dr.Matthew's. Patient says Dr.Matthew did not complete her surgery. Patient would like another referral due to Dr.Matthew's will be away for some time and [place] [facility].

## 2013-12-03 NOTE — Telephone Encounter (Signed)
Dr. Hyacinth MeekerMiller, okay to request copies of this patient's imaging?

## 2013-12-12 NOTE — Telephone Encounter (Signed)
Patient calling to check on status of referral to [facility]?

## 2013-12-13 NOTE — Telephone Encounter (Signed)
Message left to return call to [facility] at [phone] on mobile   Discussed with Dr. Hyacinth Meeker. Patient will need to sign release to our office to request medical records from [place]. Will mail out release if patient would like this.   Patient declined referral to [facility] prior, will ensure that she would like referral and place as necessary.

## 2013-12-13 NOTE — Telephone Encounter (Addendum)
Spoke with patient. She states that [employer] of [facility] were to mail out records to [place] two weeks ago. I advised have not seen records. She will follow up with [facility]. I advised would could not request her records without her signed release to our office to allow [place] to obtain records from [place].  She states that she has since spoken/seen Dr. Ashley Royalty since our last discussion and states that Dr. Ashley Royalty is closing her practice and moving to [facility] that she would be happy to do the surgery when she moves to [facility]. Patient prefers now to have a second opinion from another provider and requests a referral from Dr. Hyacinth Meeker to [facility] or [place] for second opinion and evaluation of her surgery.   I advised I would send her request to Dr. Hyacinth Meeker for review and to obtain referral information. Patient is agreeable to this plan.

## 2013-12-17 NOTE — Telephone Encounter (Addendum)
Referral placed to Dr. Junious Silk at [facility].  Appointment made for consult on 12/31/13 at 2:30 Appointment line to return call [phone], spoke with [facility].   [address] [place], [place]  [employer] Fax [phone]  Message left to return call to [place] at [phone] to patient to discuss referral information.

## 2013-12-17 NOTE — Addendum Note (Signed)
Addended by: Joeseph Amor on: 12/17/2013 02:52 PM   Modules accepted: Orders

## 2013-12-17 NOTE — Telephone Encounter (Signed)
Please make the referral.  My suggestion:  Cindy L. Karis Juba, MD  Urogynecologist   [phone]  [phone] Appointment lines  [phone] Fax number

## 2013-12-17 NOTE — Telephone Encounter (Signed)
Dr. Hyacinth Meeker, okay to enter referral for a provider at [facility]? Patient is requesting.

## 2013-12-19 ENCOUNTER — Telehealth: Payer: Self-pay | Admitting: Obstetrics & Gynecology

## 2013-12-19 NOTE — Telephone Encounter (Signed)
Please see telephone encounter dated 11/28/13. Patient notified. Will close encounter.  Routing to provider for final review. Patient agreeable to disposition. Will close encounter

## 2013-12-19 NOTE — Telephone Encounter (Signed)
Spoke with patient. She was given referral information and is agreeable to appointment with Dr. Karis Juba. She requests that I mail her a medical records release so that we can obtain records from [facility] and that she didn't get the form from [facility]. Advised would mail her a release of information.  Placed in Mail.     She states she has a uti right now and that she is being treated for it by Dr. Logan Bores, so she is feeling "okay". She would just like to update Dr. Hyacinth Meeker.

## 2013-12-19 NOTE — Telephone Encounter (Signed)
Patient calling to check on status of referral. Previous phone note closed but here are the notes copied:  Status: Addendum        Referral placed to Dr. Junious Silk at [facility].  Appointment made for consult on 12/31/13 at 2:30  Appointment line to return call [phone], spoke with [facility].  [address] [place], [place]  [employer] Fax [phone]  Message left to return call to [place] at [phone] to patient to discuss referral information.

## 2014-02-04 DIAGNOSIS — IMO0002 Reserved for concepts with insufficient information to code with codable children: Secondary | ICD-10-CM | POA: Insufficient documentation

## 2014-02-04 DIAGNOSIS — G8929 Other chronic pain: Secondary | ICD-10-CM | POA: Insufficient documentation

## 2014-05-19 ENCOUNTER — Encounter: Payer: Self-pay | Admitting: Obstetrics & Gynecology

## 2014-08-16 ENCOUNTER — Other Ambulatory Visit: Payer: Self-pay | Admitting: Certified Nurse Midwife

## 2014-08-18 NOTE — Telephone Encounter (Signed)
Medication refill request: Vaniqa 13.9%cream Last AEX:  10/14/13 Next AEX: 10/21/14 Last MMG (if hormonal medication request): 08/08/13 BIRADS1:Neg Refill authorized: ?

## 2014-10-21 ENCOUNTER — Ambulatory Visit (INDEPENDENT_AMBULATORY_CARE_PROVIDER_SITE_OTHER): Payer: 59 | Admitting: Obstetrics & Gynecology

## 2014-10-21 ENCOUNTER — Encounter: Payer: Self-pay | Admitting: Obstetrics & Gynecology

## 2014-10-21 VITALS — BP 122/66 | HR 76 | Resp 18 | Ht 60.0 in | Wt 124.0 lb

## 2014-10-21 DIAGNOSIS — Z01419 Encounter for gynecological examination (general) (routine) without abnormal findings: Secondary | ICD-10-CM

## 2014-10-21 MED ORDER — BUPROPION HCL ER (XL) 150 MG PO TB24: 150.0000 mg | ORAL_TABLET | Freq: Every day | ORAL | Status: DC
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
## 2014-11-17 ENCOUNTER — Ambulatory Visit: Payer: 59 | Admitting: Podiatry

## 2014-11-19 ENCOUNTER — Encounter: Payer: Self-pay | Admitting: Podiatry

## 2014-11-19 ENCOUNTER — Ambulatory Visit (INDEPENDENT_AMBULATORY_CARE_PROVIDER_SITE_OTHER): Payer: 59 | Admitting: Podiatry

## 2014-11-19 ENCOUNTER — Ambulatory Visit (INDEPENDENT_AMBULATORY_CARE_PROVIDER_SITE_OTHER): Payer: 59

## 2014-11-19 VITALS — BP 124/81 | HR 73 | Resp 15

## 2014-11-19 DIAGNOSIS — M21612 Bunion of left foot: Secondary | ICD-10-CM

## 2014-11-19 DIAGNOSIS — M2012 Hallux valgus (acquired), left foot: Secondary | ICD-10-CM
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
## 2014-11-19 NOTE — Progress Notes (Signed)
   Subjective:    Patient ID: Alicia Fernandez, female    DOB: [birth year], 59 y.o.   MRN: [id number]  HPI Pt presents with left foot bunion  Review of Systems  Musculoskeletal: Positive for back pain and arthralgias.  All other systems reviewed and are negative.      Objective:   Physical Exam        Assessment & Plan:

## 2014-11-20 ENCOUNTER — Ambulatory Visit: Payer: 59 | Admitting: Podiatry

## 2014-11-26 ENCOUNTER — Encounter: Payer: Self-pay | Admitting: Podiatry

## 2014-11-26 ENCOUNTER — Ambulatory Visit (INDEPENDENT_AMBULATORY_CARE_PROVIDER_SITE_OTHER): Payer: 59 | Admitting: Podiatry

## 2014-11-26 VITALS — BP 126/82 | HR 76 | Resp 18

## 2014-11-26 DIAGNOSIS — M2012 Hallux valgus (acquired), left foot: Secondary | ICD-10-CM | POA: Diagnosis not present

## 2014-11-26 DIAGNOSIS — M21612 Bunion of left foot: Secondary | ICD-10-CM

## 2014-11-26 NOTE — Patient Instructions (Signed)
Pre-Operative Instructions  Congratulations, you have decided to take an important step to improving your quality of life.  You can be assured that the doctors of [facility] will be with you every step of the way.  1. Plan to be at the surgery center/hospital at least 1 (one) hour prior to your scheduled time unless otherwise directed by the surgical center/hospital staff.  You must have a responsible adult accompany you, remain during the surgery and drive you home.  Make sure you have directions to the surgical center/hospital and know how to get there on time. 2. For hospital based surgery you will need to obtain a history and physical form from your family physician within 1 month prior to the date of surgery- we will give you a form for you primary physician.  3. We make every effort to accommodate the date you request for surgery.  There are however, times where surgery dates or times have to be moved.  We will contact you as soon as possible if a change in schedule is required.   4. No Aspirin/Ibuprofen for one week before surgery.  If you are on aspirin, any non-steroidal anti-inflammatory medications (Mobic, Aleve, Ibuprofen) you should stop taking it 7 days prior to your surgery.  You make take Tylenol  For pain prior to surgery.  5. Medications- If you are taking daily heart and blood pressure medications, seizure, reflux, allergy, asthma, anxiety, pain or diabetes medications, make sure the surgery center/hospital is aware before the day of surgery so they may notify you which medications to take or avoid the day of surgery. 6. No food or drink after midnight the night before surgery unless directed otherwise by surgical center/hospital staff. 7. No alcoholic beverages 24 hours prior to surgery.  No smoking 24 hours prior to or 24 hours after surgery. 8. Wear loose pants or shorts- loose enough to fit over bandages, boots, and casts. 9. No slip on shoes, sneakers are best. 10. Bring  your boot with you to the surgery center/hospital.  Also bring crutches or a walker if your physician has prescribed it for you.  If you do not have this equipment, it will be provided for you after surgery. 11. If you have not been contracted by the surgery center/hospital by the day before your surgery, call to confirm the date and time of your surgery. 12. Leave-time from work may vary depending on the type of surgery you have.  Appropriate arrangements should be made prior to surgery with your employer. 13. Prescriptions will be provided immediately following surgery by your doctor.  Have these filled as soon as possible after surgery and take the medication as directed. 14. Remove nail polish on the operative foot. 15. Wash the night before surgery.  The night before surgery wash the foot and leg well with the antibacterial soap provided and water paying special attention to beneath the toenails and in between the toes.  Rinse thoroughly with water and dry well with a towel.  Perform this wash unless told not to do so by your physician.  Enclosed: 1 Ice pack (please put in freezer the night before surgery)   1 Hibiclens skin cleaner   Pre-op Instructions  If you have any questions regarding the instructions, do not hesitate to call our office.  [place]: [address] [place], [place] [address] [phone]  [place]: [address] [place], [place] [address] [phone]  [place]: [address]  [place], [place] [address] [phone]  Dr. Richard   Tuchman DPM, Dr. Norman Regal DPM Dr. Richard Sikora DPM, Dr. M. Todd Hyatt DPM, Dr. Kathryn Egerton DPM, Dr. Tylea Hise DPM 

## 2014-11-28 ENCOUNTER — Telehealth: Payer: Self-pay | Admitting: *Deleted
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
## 2014-12-03 NOTE — Telephone Encounter (Signed)
"  I was in yesterday.  Dr. Charlsie Merlesegal referred me to Dr. Ardelle AntonWagoner for surgery.  It's set up for June 6th.  I'd like to change it to September so I can enjoy my summer a little bit."  I returned her call.  We can reschedule surgery to September.  Would you like to reschedule it now or call back when it gets closer?  "I'd like to go ahead and reschedule it now.  I'd like it to be mid September so I can enjoy doing things this summer.  Can we do it on 04/06/2015?"  That day will be fine.  I'll get it rescheduled.  "Is there anything else I need to know?"  No, the surgical center will call you a day or two before your surgery date and give you the arrival time.  Remember not to eat or drink anything after midnight the night before surgery.  Someone will need to go with you to the procedure because you will not be able to drive.  You will be under a local / IV sedation.  "Okay, thank you so much."

## 2014-12-17 ENCOUNTER — Other Ambulatory Visit: Payer: Self-pay | Admitting: Obstetrics & Gynecology

## 2014-12-18 ENCOUNTER — Telehealth: Payer: Self-pay | Admitting: *Deleted

## 2014-12-18 NOTE — Telephone Encounter (Signed)
Medication refill request: Premarin vaginal cream 30 gm Last AEX:  10/21/14 with SM Next AEX: 01/01/16 with SM Last MMG (if hormonal medication request): 09/10/14 bi-rads 2: benign Refill authorized: #30 gm/0 rfs, please advise.

## 2014-12-18 NOTE — Telephone Encounter (Signed)
I left a message with patient's spouse to get her to give me a call.  We need to move her surgery from 04/06/2015 to 04/08/2015 due to schedule change for Dr. Ardelle AntonWagoner.

## 2014-12-31 NOTE — Telephone Encounter (Signed)
I called and informed patient that Dr. Gabriel Rung surgery date has changed to Wednesday.  We have you scheduled for surgery on 04/06/2015.  Is it okay to switch you to 04/08/2015?  "Sure, that will be fine.  I will be notified before the surgery right?"  Yes, they will call you a day or 2 before the date and give you and exact arrival time.

## 2015-03-30 ENCOUNTER — Telehealth: Payer: Self-pay | Admitting: *Deleted

## 2015-03-30 NOTE — Telephone Encounter (Signed)
Authorization for surgery scheduled for 04/08/2015 was faxed to [facility] at the surgical center.  Authorization number is [id number], Lapidus, Calcaneal Bone Graft Harvest, and Extensor Tendon Lengthening right foot.

## 2015-04-08 ENCOUNTER — Encounter: Payer: Self-pay | Admitting: *Deleted

## 2015-04-08 ENCOUNTER — Other Ambulatory Visit: Payer: Self-pay | Admitting: Podiatry

## 2015-04-08 DIAGNOSIS — M2020 Hallux rigidus, unspecified foot: Secondary | ICD-10-CM | POA: Diagnosis not present

## 2015-04-08 DIAGNOSIS — Z981 Arthrodesis status: Secondary | ICD-10-CM

## 2015-04-08 DIAGNOSIS — Z9889 Other specified postprocedural states: Secondary | ICD-10-CM

## 2015-04-08 DIAGNOSIS — M2010 Hallux valgus (acquired), unspecified foot: Secondary | ICD-10-CM | POA: Diagnosis not present

## 2015-04-08 DIAGNOSIS — M6700 Short Achilles tendon (acquired), unspecified ankle: Secondary | ICD-10-CM | POA: Diagnosis not present

## 2015-04-09 ENCOUNTER — Telehealth: Payer: Self-pay | Admitting: *Deleted

## 2015-04-09 NOTE — Telephone Encounter (Signed)
I'm calling to see how you are doing following your surgery.  "I'm doing okay.  Everything went well at the surgical center.  I'm keeping it elevated.  My leg is still numb so I'm not having any pain.  Do you know if they put a screw or a plate in my foot when he did the surgery?"  No, I'm not sure.  "I guess I'll ask him when I come in to see him on the 30th.  Thank you for calling me."

## 2015-04-09 NOTE — Progress Notes (Signed)
Surgery performed at [facility].  Prescription was written for Percocet 10/325 quantity 30, Clindamycin 300 mg quantity 21 and Phenergan 25 mg quantity 30.

## 2015-04-13 ENCOUNTER — Encounter: Payer: Self-pay | Admitting: Podiatry

## 2015-04-15 ENCOUNTER — Telehealth: Payer: Self-pay | Admitting: *Deleted

## 2015-04-15 ENCOUNTER — Ambulatory Visit (INDEPENDENT_AMBULATORY_CARE_PROVIDER_SITE_OTHER): Payer: 59

## 2015-04-15 ENCOUNTER — Ambulatory Visit (INDEPENDENT_AMBULATORY_CARE_PROVIDER_SITE_OTHER): Payer: 59 | Admitting: Podiatry

## 2015-04-15 DIAGNOSIS — Z9889 Other specified postprocedural states: Secondary | ICD-10-CM

## 2015-04-15 DIAGNOSIS — Z981 Arthrodesis status: Secondary | ICD-10-CM | POA: Diagnosis not present

## 2015-04-15 MED ORDER — OXYCODONE-ACETAMINOPHEN 10-325 MG PO TABS: 1.0000 | ORAL_TABLET | Freq: Four times a day (QID) | ORAL | Status: DC | PRN

## 2015-04-15 NOTE — Telephone Encounter (Signed)
Come in today

## 2015-04-15 NOTE — Progress Notes (Signed)
Patient ID: Alicia Fernandez, female   DOB: [birth year], 59 y.o.   MRN: [id number]  DOS: 04/08/15 s/p Left 1st MTPJ fusion  Subjective: 59 year old female presents the office today for an unscheduled appointment due to increased pain to her left foot. She states that last night she fell over her cat and had increased pain since then. She's been taking the pain medication which seem to help quite a bit prior to this accident. Denies any other injury at the time of the fall. She is remaining nonweightbearing. She finished her course of antibiotics. She denies any systemic complaints as fevers, chills, nausea, vomiting. No calf pain, chest pain, shortness of breath. No other complaints at this time and no other acute changes.  Objective: AAO 3, NAD DP/PT pulses palpable, CRT less than 3 seconds Protective sensation appears to be intact with Dorann Ou monofilament Incisional the dorsal medial aspect of the left foot as well coapted with sutures intact and there is no evidence of dehiscence. There is moderate edema around the surgical site. There is a small amount of pain erythema overlying the area of edema. This erythematous located inflammation. There is no increase in warmth there is no ascending cellulitis. There is tenderness to palpation along the surgical site. Arthrodesis site appears to be stable. There is no other areas of tenderness to bilateral lower extremities. There are no open lesions or pre-ulcerative lesions. There is no pain with calf compression, swelling, warmth, erythema.  Assessment: 59 year old female 1 week status post left first MTPJ fusion with increased pain after fall  Plan: -Treatment options discussed including all alternatives, risks, and complications -X-rays were obtained and reviewed with the patient.  -Antibiotic ointment was placed over the incision followed by dry sterile dressing. Keep the dressing clean, dry, intact. -Continue nonweightbearing in a cam  boot. CAM boot was dispensed today. Will order a rolling knee scooter. -Aspirin daily. -Pain medication refill. -Ice and elevation. -Monitor for any clinical signs or symptoms of infection and directed to call the office immediately should any occur or go to the ER. -Follow-up in 1 week for suture removal or sooner if any problems arise. In the meantime, encouraged to call the office with any questions, concerns, change in symptoms.   Ovid Curd, DPM

## 2015-04-15 NOTE — Telephone Encounter (Signed)
Pt states she fell last night and caught herself on the surgical foot.  I called pt to offer an appt, and pt states she is in contact with Dr. Ardelle Anton, and waiting his call to see what should be done.  I transferred to schedulers.

## 2015-04-17 ENCOUNTER — Encounter: Payer: Self-pay | Admitting: Podiatry

## 2015-04-20 ENCOUNTER — Telehealth: Payer: Self-pay | Admitting: *Deleted

## 2015-04-20 MED ORDER — OXYCODONE-ACETAMINOPHEN 5-325 MG PO TABS: 1.0000 | ORAL_TABLET | Freq: Four times a day (QID) | ORAL | Status: DC | PRN

## 2015-04-20 MED ORDER — OXYCODONE-ACETAMINOPHEN 10-325 MG PO TABS: 1.0000 | ORAL_TABLET | Freq: Four times a day (QID) | ORAL | Status: DC | PRN

## 2015-04-20 NOTE — Telephone Encounter (Signed)
Pt called states the Percocet is upseting her stomach.  Dr. Ardelle Anton decreased dosing to Percocet 5/325mg  #40 one tablet every 6 hours.  Pt's husband picked up.

## 2015-04-20 NOTE — Telephone Encounter (Signed)
Pt states she would like to know if she can get the surgical foot wet yet, and she needs more pain medication. I told pt to leave the current dressing in place until after the sutures were removed, at the suture removal appt be sure to ask about showering. I reminded pt the rx would need to be picked up in the [place] office. Dr. Ardelle Anton refilled the Percocet 10/325 #30.

## 2015-04-21 ENCOUNTER — Telehealth: Payer: Self-pay | Admitting: *Deleted

## 2015-04-21 NOTE — Telephone Encounter (Signed)
Who is Helmut Muster first off? And second, I was told that the patient was having problems with the percocet, so I decreased the amount to see if that would help. When the husband came to pick it up he asked why it was being deceased and I told whoever asked me about it (I forgot who) why. I don't know who spoke to this patient's husband, but I would like to find out and I will call the patient tomorrow myself.

## 2015-04-21 NOTE — Telephone Encounter (Signed)
Alicia Fernandez state pt left long message stating she had not been on her antibiotic for a week and she didn't appreciate her husband being told she was a drug seeker.  Alicia Fernandez states pt is not in their system, but knew our office would want to know about the call.

## 2015-04-22 ENCOUNTER — Telehealth: Payer: Self-pay | Admitting: Podiatry

## 2015-04-22 NOTE — Telephone Encounter (Signed)
I tried calling the patient to discuss the issues with her pain medication, no answer. I decreased the amount from Percocet 10/325 to 5/325 as it was upsetting her stomach. She should be off antibiotics at this point after surgery. Will try calling again.

## 2015-04-23 ENCOUNTER — Encounter: Payer: 59 | Admitting: Podiatry

## 2015-04-23 ENCOUNTER — Encounter: Payer: Self-pay | Admitting: Podiatry

## 2015-04-23 DIAGNOSIS — Z981 Arthrodesis status: Secondary | ICD-10-CM

## 2015-04-23 DIAGNOSIS — Z9889 Other specified postprocedural states: Secondary | ICD-10-CM

## 2015-04-23 MED ORDER — OXYCODONE HCL 10 MG PO TABS: 10.0000 mg | ORAL_TABLET | Freq: Four times a day (QID) | ORAL | Status: DC

## 2015-04-23 NOTE — Addendum Note (Signed)
Addended by: Ovid Curd R on: 04/23/2015 04:33 PM   Modules accepted: Orders, Level of Service

## 2015-04-23 NOTE — Progress Notes (Addendum)
Patient ID: Alicia Fernandez, female   DOB: 27-Jun-1956, 59 y.o.   MRN: [id number]  I tried calling the patient again today as she missed her appointment, no answer.   Addendum: The patient showed up for her appointment at 4pm and I agreed to see her. Progress note below.  -------------------------------------------------  DOS: 04/08/15 s/p Left 1st MTPJ fusion  Subjective: 59 year old female presents the office today for postop visit #2. She presents today for suture removal. She presents a walking in the cam boot. She states that she does walk intermittently but she does try to use her rolling walker. She denies any recent injury or trauma. She states that she call the office of the day because her stomach is upset. Her pain medication was decreased Percocet 5/325 although she states it is the acetaminophen that upsets her stomach. She denies any systemic complaints as fevers, chills, nausea, vomiting. She denies any calf pain, chest pain, soreness of breath. No other complaints at this time. No acute changes.   Objective: AAO 3, NAD; presents weightbearing in the CAM boot. DP/PT pulses palpable, CRT less than 3 seconds Protective sensation appears to be intact with Dorann Ou monofilament Incisional the dorsal medial aspect of the left foot as well coapted with sutures intact and there is no evidence of dehiscence. There is continued edema around the surgical site. There is a mild amount of pain upon palpation of the surgical site. There is no surrounding erythema, ascending cellulitis, fluctuance, crepitus, malodor. There is no drainage or purulence. There is no clinical signs of infection. Other digits the site appears to be stable. There are no other open lesions or pre-ulcerative lesions. There is no pain with calf compression, swelling, warmth, erythema.  Assessment: 59 year old female 2 weeks status post left first MTPJ fusion   Plan: -Treatment options discussed including all  alternatives, risks, and complications  -suture ends were cut. Antibiotic ointment was placed over the incision followed by dry sterile dressing. Keep the dressing clean, dry, intact. she can start to shower tomorrow fungus incision remains coapted. There is any problems incision to hold off on showing call the office.  -Continue nonweightbearing in a cam boot. CAM boot was dispensed today. she must remain nonweightbearing all times and again reinforced this to her today.  -Aspirin daily. -Pain medication-discontinue the Percocet due to upsetting his stomach and we'll prescribe oxycodone pain 10 mg.  -Ice and elevation. -Monitor for any clinical signs or symptoms of infection and directed to call the office immediately should any occur or go to the ER. -Follow-up in 2 weeks or sooner if any problems arise. In the meantime, encouraged to call the office with any questions, concerns, change in symptoms.   Ovid Curd, DPM

## 2015-04-23 NOTE — Addendum Note (Signed)
Addended by: Ovid Curd R on: 04/23/2015 05:05 PM   Modules accepted: Orders

## 2015-04-28 ENCOUNTER — Encounter: Payer: Self-pay | Admitting: Podiatry

## 2015-05-08 ENCOUNTER — Encounter: Payer: Self-pay | Admitting: Podiatry

## 2015-05-08 ENCOUNTER — Ambulatory Visit (INDEPENDENT_AMBULATORY_CARE_PROVIDER_SITE_OTHER): Payer: 59

## 2015-05-08 ENCOUNTER — Ambulatory Visit (INDEPENDENT_AMBULATORY_CARE_PROVIDER_SITE_OTHER): Payer: 59 | Admitting: Podiatry

## 2015-05-08 VITALS — BP 124/78 | HR 73 | Resp 16

## 2015-05-08 DIAGNOSIS — M21612 Bunion of left foot: Secondary | ICD-10-CM | POA: Diagnosis not present

## 2015-05-08 DIAGNOSIS — Z9889 Other specified postprocedural states: Secondary | ICD-10-CM

## 2015-05-11 NOTE — Progress Notes (Signed)
Patient ID: Alicia Fernandez, female   DOB: 03-16-1956, 59 y.o.   MRN: [id number]  DOS: 04/08/15 status post left first MTPJ fusion  Subjective: 59 year old female presents the office today status post left foot surgery. She states overall she is doing well. She has been continuing change the bandage daily to her foot and he been applying antibiotic ointment and a dressing. They've also been applying Steri-Strips to the surgical site. She states that she is happy the way of the toe is healing she is continuing the CAM boot and she presents in a rolling walker for which she uses the majority of the time. She does continue to wear in her foot at times. She denies any systemic complaints such as fevers, chills, nausea, vomiting. No calf pain, chest pain, shortness of breath. No other complaints at this time.  Objective: AAO 3, NAD DP/PT pulses palpable, CRT less than 3 seconds Protective sensation intact with Simms Weinstein monofilament. There is some subjective numbness to the hallux however this is intermittent and improving. Incision is well coapted without any evidence of dehiscence. There is no surrounding erythema, ascending saline, fluctuance, crepitus, malodor, drainage/purulence. There is mild edema over surgical site however does appear to be improved compared last appointment. There is no other areas of tenderness to bilateral lower extremities. There is hammertoe contractures with medial deviation of the digits 2 through 5. No other open lesions or pre-ulcerative lesions. No pain with calf compression, swelling, warmth, erythema.  Assessment: 59 year old female status post left first MTPJ fusion  Plan: -Treatment options discussed including all alternatives, risks, and complications -X-rays were obtained and reviewed with the patient.  -Antibiotic ointment was place of the incision followed by a dressing. Continue daily dressing changes. -Continue nonweightbearing in a cam boot. -Ice  and elevation -Pain medication as needed -Monitor for any clinical signs or symptoms of infection and directed to call the office immediately should any occur or go to the ER. -Follow-up in 2 weeks or sooner if any problems arise. In the meantime, encouraged to call the office with any questions, concerns, change in symptoms.   Ovid CurdMatthew Wagoner, DPM

## 2015-05-22 ENCOUNTER — Encounter: Payer: Self-pay | Admitting: Podiatry

## 2015-05-22 ENCOUNTER — Ambulatory Visit (INDEPENDENT_AMBULATORY_CARE_PROVIDER_SITE_OTHER): Payer: 59 | Admitting: Podiatry

## 2015-05-22 ENCOUNTER — Ambulatory Visit (INDEPENDENT_AMBULATORY_CARE_PROVIDER_SITE_OTHER): Payer: 59

## 2015-05-22 VITALS — BP 97/64 | HR 77 | Resp 18

## 2015-05-22 DIAGNOSIS — Z9889 Other specified postprocedural states: Secondary | ICD-10-CM | POA: Diagnosis not present

## 2015-05-22 DIAGNOSIS — Z981 Arthrodesis status: Secondary | ICD-10-CM

## 2015-05-22 DIAGNOSIS — M21612 Bunion of left foot: Secondary | ICD-10-CM

## 2015-05-28 NOTE — Progress Notes (Signed)
Patient ID: Alicia Fernandez, female   DOB: May 30, 1956, 59 y.o.   MRN: [id number]  Subjective: Alicia Fernandez is a 59 y.o. is seen today in office s/p left 1st MTPJ fusion preformed on 04/08/15. They state their pain is improved and she has been trying to stay off of it.  She has been wearing the cam boot.Denies any systemic complaints such as fevers, chills, nausea, vomiting. No calf pain, chest pain, shortness of breath.   Objective: General: No acute distress, AAOx3,  Presents today weightbearing in a cam boot. DP/PT pulses palpable 2/4, CRT < 3 sec to all digits.  Protective sensation intact. Motor function intact.  Right foot: Incision is well coapted without any evidence of dehiscence and a scar has formed. There is no surrounding erythema, ascending cellulitis, fluctuance, crepitus, malodor, drainage/purulence. There is mild edema around the surgical site. There is very mild pain along the surgical site.  Arthrodesis site appears to be stable. There does appear to be of flexion contracture of the hallux IPJ. There is also hammertoe contractures present with medial deviation of all the toes. No other areas of tenderness to bilateral lower extremities.  No other open lesions or pre-ulcerative lesions.  No pain with calf compression, swelling, warmth, erythema.   Assessment and Plan:  Status post left foot surgery, doing well with no complications   -X-rays were obtained and reviewed with the patient.  -Treatment options discussed including all alternatives, risks, and complications -Ice/elevation -Pain medication as needed. - she can transition to weightbearing as tolerated in a Darco shoe. This was dispensed to her today. - at today's appointment she was inquiring about the contracture of the hallux IPJ. I discussed with her likely etiologies of this. If this becomes symptomatic in the future may need a hallux IPJ fusion however she states that she will see if it bothers her when she  returns to shoe although she will hold off on further surgery for now as it is not bothering her. She was also inquiring about what the hammertoes are nontender and surgery. We had discussed this prior to surgery and they were not bothering her and she wished to hold off on that. She is also a hammertoes at the last appointment as well. If she desires correction of the toes we can do this in the future however they are not symptomatic at this time. There were also nonsymptomatic prior to surgery and she did not want to have the surgery performed at the same time as this surgery. -Monitor for any clinical signs or symptoms of infection and DVT/PE and directed to call the office immediately should any occur or go to the ER. -Follow-up in 2-3 weeks or sooner if any problems arise. In the meantime, encouraged to call the office with any questions, concerns, change in symptoms.   Ovid CurdMatthew Wagoner, DPM

## 2015-06-09 ENCOUNTER — Encounter: Payer: 59 | Admitting: Podiatry

## 2015-06-30 ENCOUNTER — Encounter: Payer: Self-pay | Admitting: Podiatry

## 2015-06-30 ENCOUNTER — Ambulatory Visit (INDEPENDENT_AMBULATORY_CARE_PROVIDER_SITE_OTHER): Payer: 59 | Admitting: Podiatry

## 2015-06-30 ENCOUNTER — Ambulatory Visit (INDEPENDENT_AMBULATORY_CARE_PROVIDER_SITE_OTHER): Payer: 59

## 2015-06-30 VITALS — BP 114/86 | HR 69 | Resp 12

## 2015-06-30 DIAGNOSIS — M2012 Hallux valgus (acquired), left foot: Secondary | ICD-10-CM | POA: Diagnosis not present

## 2015-06-30 DIAGNOSIS — Z981 Arthrodesis status: Secondary | ICD-10-CM

## 2015-06-30 DIAGNOSIS — M2042 Other hammer toe(s) (acquired), left foot: Secondary | ICD-10-CM

## 2015-07-02 NOTE — Progress Notes (Signed)
Patient ID: Alicia Fernandez, female   DOB: 25-Jul-1955, 59 y.o.   MRN: [id number]  Subjective: Alicia Fernandez is a 59 y.o. is seen today in office s/p left 1st MTPJ fusion preformed on 04/08/15. Since last appointment she is transitioned to regular shoe. She states that overall she is doing well. She does that toe is somewhat short but she does not have any pain to it. She is more concerned about the second toe how it is lying towards the big toe. Denies any systemic complaints such as fevers, chills, nausea, vomiting. No calf pain, chest pain, shortness of breath.   Objective: General: No acute distress, AAOx3,  Presents today weightbearing in a cam boot. DP/PT pulses palpable 2/4, CRT < 3 sec to all digits.  Protective sensation intact. Motor function intact.  Right foot: Incision is well coapted without any evidence of dehiscence and a scar has formed. There is no surrounding erythema, ascending cellulitis, fluctuance, crepitus, malodor, drainage/purulence. There is trace edema around the surgical site. There is no pain along the surgical site.  Arthrodesis site appears to be stable. There does appear to be of flexion contracture of the hallux IPJ. There is also hammertoe contractures present with medial deviation of all the toes, most significant is the 2nd.  No other areas of tenderness to bilateral lower extremities.  No other open lesions or pre-ulcerative lesions.  No pain with calf compression, swelling, warmth, erythema.   Assessment and Plan:  Status post left foot surgery, doing well with no complications; hammertoes   -X-rays were obtained and reviewed with the patient.  -Treatment options discussed including all alternatives, risks, and complications -Ice/elevation -Pain medication as needed. -At this time continue supportive shoe gear. -Had along discussion with the patient in regards to surgical intervention for the hammertoes. She would likely need hammertoe repair as well  as MPJ release and K wire fixation of the lesser digits. She would likely see with this after the first year. I'll see her back at that time to further discuss the surgery. -Vitamin E creamer cocoa butter if any incision daily. -Compression sock as needed for swelling.  -Monitor for any clinical signs or symptoms of infection and DVT/PE and directed to call the office immediately should any occur or go to the ER. -Follow-up after the 1st of the year or sooner if any problems arise. In the meantime, encouraged to call the office with any questions, concerns, change in symptoms.   Ovid CurdMatthew Sola Margolis, DPM

## 2015-07-26 ENCOUNTER — Encounter: Payer: Self-pay | Admitting: Emergency Medicine

## 2015-07-26 ENCOUNTER — Emergency Department
Admission: EM | Admit: 2015-07-26 | Discharge: 2015-07-26 | Disposition: A | Discharge status: Home / Self Care | Payer: BLUE CROSS/BLUE SHIELD | Source: Home / Self Care | Attending: Family Medicine | Admitting: Family Medicine

## 2015-07-26 DIAGNOSIS — N390 Urinary tract infection, site not specified: Secondary | ICD-10-CM | POA: Diagnosis not present

## 2015-07-26 LAB — POCT URINALYSIS DIP (MANUAL ENTRY)
Bilirubin, UA: NEGATIVE
GLUCOSE UA: NEGATIVE
Ketones, POC UA: NEGATIVE
Leukocytes, UA: NEGATIVE
NITRITE UA: NEGATIVE
PH UA: 6.5
PROTEIN UA: NEGATIVE
RBC UA: NEGATIVE
SPEC GRAV UA: 1.02
UROBILINOGEN UA: 0.2

## 2015-07-26 MED ORDER — CEFTRIAXONE SODIUM 1 G IJ SOLR
1.0000 g | Freq: Once | INTRAMUSCULAR | Status: AC
  Administered 2015-07-26: 1 g via INTRAMUSCULAR

## 2015-07-26 MED ORDER — CEPHALEXIN 500 MG PO CAPS: 500.0000 mg | ORAL_CAPSULE | Freq: Three times a day (TID) | ORAL | Status: DC

## 2015-07-26 NOTE — Discharge Instructions (Signed)
Increase fluid intake. °If symptoms become significantly worse during the night or over the weekend, proceed to the local emergency room.  °
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
## 2015-07-26 NOTE — ED Notes (Signed)
Pt c/o having kidney/back pain for a couple of days.  Pt does have a history of IC, taking low dose Macrodantin and Cipro to help with sxs.

## 2015-07-28 ENCOUNTER — Ambulatory Visit: Payer: 59 | Admitting: Podiatry

## 2015-07-28 LAB — URINE CULTURE
COLONY COUNT: NO GROWTH
ORGANISM ID, BACTERIA: NO GROWTH

## 2015-07-29 ENCOUNTER — Telehealth: Payer: Self-pay | Admitting: *Deleted

## 2015-07-30 ENCOUNTER — Ambulatory Visit (INDEPENDENT_AMBULATORY_CARE_PROVIDER_SITE_OTHER): Payer: BLUE CROSS/BLUE SHIELD | Admitting: Podiatry

## 2015-07-30 ENCOUNTER — Encounter: Payer: Self-pay | Admitting: Podiatry

## 2015-07-30 ENCOUNTER — Ambulatory Visit (INDEPENDENT_AMBULATORY_CARE_PROVIDER_SITE_OTHER): Payer: BLUE CROSS/BLUE SHIELD

## 2015-07-30 VITALS — BP 127/86 | HR 86 | Resp 12

## 2015-07-30 DIAGNOSIS — R52 Pain, unspecified: Secondary | ICD-10-CM | POA: Diagnosis not present

## 2015-07-30 DIAGNOSIS — Z981 Arthrodesis status: Secondary | ICD-10-CM

## 2015-07-30 DIAGNOSIS — M2042 Other hammer toe(s) (acquired), left foot: Secondary | ICD-10-CM | POA: Diagnosis not present

## 2015-07-30 DIAGNOSIS — M2012 Hallux valgus (acquired), left foot: Secondary | ICD-10-CM

## 2015-07-30 DIAGNOSIS — M2450 Contracture, unspecified joint: Secondary | ICD-10-CM | POA: Diagnosis not present

## 2015-07-30 DIAGNOSIS — M2032 Hallux varus (acquired), left foot: Secondary | ICD-10-CM

## 2015-07-30 NOTE — Patient Instructions (Signed)

## 2015-07-31 ENCOUNTER — Telehealth: Payer: Self-pay | Admitting: *Deleted

## 2015-07-31 NOTE — Telephone Encounter (Signed)
"  I'd like to schedule my surgery.  I was there yesterday and the nurse told me I could have it done by the end of the month before my insurance changes."  He may be able to do it Wednesday of next week.  The 25th is full.  I'll need to check with Misty StanleyLisa and get your consent forms and get it scheduled.  I'll call you back once I speak with Misty StanleyLisa.  I'm returning your call.  We have you scheduled for 08/05/2015.  You had requested an estimate so Chip BoerVicki in insurance had it.  "Do you have the cpt codes that I can give to my insurance?"  The codes are [id number] times 4 digits and 28760.  The diagnosis codes are M20.40 and M20.20.  "Why are there two cpt codes?"  One is for the big toe and the other one is for the other toes.  "Oh I see, I wish we could have done all this the first time and I would not have to be dealing with this again."

## 2015-08-02 DIAGNOSIS — M2040 Other hammer toe(s) (acquired), unspecified foot: Secondary | ICD-10-CM | POA: Insufficient documentation

## 2015-08-02 DIAGNOSIS — M2030 Hallux varus (acquired), unspecified foot: Secondary | ICD-10-CM | POA: Insufficient documentation

## 2015-08-02 DIAGNOSIS — Z981 Arthrodesis status: Secondary | ICD-10-CM | POA: Insufficient documentation

## 2015-08-02 NOTE — Progress Notes (Signed)
Patient ID: Alicia Fernandez, female   DOB: 1956/06/28, 60 y.o.   MRN: [id number]   Subjective: Alicia Fernandez is a 60 y.o. is seen today in office s/p left 1st MTPJ fusion preformed on 04/08/15. He states of the big toe is doing well and the surgery site is doing well although she does continue to have pain to her lesser toes and they are deviating causing pain particularly in shoe and pressure. She also states the big toe does still contract at the tip of the toe, and pain in shoe gear. At this time she'll discuss further surgical intervention to repair the lesser toes. No recent injury or trauma. She is wearing a regular shoe. Denies any systemic complaints such as fevers, chills, nausea, vomiting. No calf pain, chest pain, shortness of breath.   Objective: General: No acute distress, AAOx3,  Presents today weightbearing in a cam boot. DP/PT pulses palpable 2/4, CRT < 3 sec to all digits.  Protective sensation intact. Motor function intact.  Right foot: Incision is well coapted without any evidence of dehiscence and a scar has formed. There is no surrounding erythema, ascending cellulitis, fluctuance, crepitus, malodor, drainage/purulence. There is trace edema around the surgical site. There is no pain along the surgical site.  Arthrodesis site appears to be stable. There does appear to be of flexion contracture of the hallux IPJ, although flexible. There is also hammertoe contractures present with medial deviation of all the toes, most significant is the 2nd. There is tenderness overlying the dorsal PIPJ to the lesser digits and the tip of the hallux and the contracture. No other areas of tenderness to bilateral lower extremities.  No other open lesions or pre-ulcerative lesions.  No pain with calf compression, swelling, warmth, erythema.   Assessment and Plan:  Status post left foot surgery, doing well with no complications; hammertoes and hallux malleus  -X-rays were obtained and reviewed  with the patient.  -Treatment options discussed including all alternatives, risks, and complications -At this time discussed both conservative and surgical treatment options for hammertoe correction. Given the extent I do recommend hammertoe correction of all the toes although the second is no significant that is only correctable cause pressure to the other toes. I discussed the PIPJ arthrodesis with K wire fixation MPJ release of the lesser digits and a arthroplasty of the fifth toe. Hallux IPJ fusion. She wishes to proceed. -The incision placement as well as the postoperative course was discussed with the patient. I discussed risks of the surgery which include, but not limited to, infection, bleeding, pain, swelling, need for further surgery, delayed or nonhealing, painful or ugly scar, numbness or sensation changes, over/under correction, recurrence, transfer lesions, further deformity, hardware failure, DVT/PE, loss of toe/foot. Patient understands these risks and wishes to proceed with surgery. The surgical consent was reviewed with the patient all 3 pages were signed. No promises or guarantees were given to the outcome of the procedure. All questions were answered to the best of my ability. Before the surgery the patient was encouraged to call the office if there is any further questions. [facility].  Ovid CurdMatthew Audriella Blakeley, DPM

## 2015-08-04 ENCOUNTER — Telehealth: Payer: Self-pay | Admitting: *Deleted

## 2015-08-04 NOTE — Telephone Encounter (Addendum)
"  We spoke last week.  We scheduled surgery for the 18th of this month.  I wanted to let you guys know.  I have a bad kidney infection.  I have to have an Ultrasound tomorrow to see if there is some abscesses on my kidney or in my kidney.  I'm in a lot of pain, not feeling well.  So, I will need to reschedule.  I will not be able to do it on the 18th."  "I'd like to reschedule sometime in March."  I received your message, so I'm returning your call.  "Can I call you back.  We're having problems with our alarm system and my husband doesn't know the code.  I'd like to reschedule it.  Is that okay?"  Sure that is fine.

## 2015-08-05 NOTE — Telephone Encounter (Signed)
"  I'm starting to feel a little better.  I was out of town when it all happened and I felt horrible.  I'm ready to reschedule.  What days do you have open?"  He can do it on February 1, 8 or 15.  He is pretty open.  "Okay, let me check with my husband and give you a call back tomorrow."

## 2015-08-11 ENCOUNTER — Ambulatory Visit (INDEPENDENT_AMBULATORY_CARE_PROVIDER_SITE_OTHER): Payer: BLUE CROSS/BLUE SHIELD | Admitting: Podiatry

## 2015-08-11 DIAGNOSIS — M2042 Other hammer toe(s) (acquired), left foot: Secondary | ICD-10-CM | POA: Diagnosis not present

## 2015-08-11 DIAGNOSIS — M2450 Contracture, unspecified joint: Secondary | ICD-10-CM | POA: Diagnosis not present

## 2015-08-11 DIAGNOSIS — M2032 Hallux varus (acquired), left foot: Secondary | ICD-10-CM

## 2015-08-11 NOTE — Telephone Encounter (Signed)
Patient came in for an appointment.  Surgery will be scheduled for 08/26/2015.  Patient stated she would call if she needs to change the date.  Needs to speak with her husband regarding the date.

## 2015-08-11 NOTE — Patient Instructions (Signed)

## 2015-08-13 NOTE — Progress Notes (Signed)
Patient ID: Alicia Fernandez, female   DOB: 1956-03-26, 60 y.o.   MRN: [id number]  Subjective: Alicia Fernandez is a 60 y.o. is seen today for an originally scheduled postop visit #1 however she had a cancel her surgery due to a bladder infection today. Today she states that she never actually scheduled her surgery, however this was scheduled and she did call to cancel. She also presents today to further discuss the surgery again in detail. She does state that her big toe continues to bend down and she has difficulty wearing shoes and she has tripped because of the toe. She also states that her other toes are contracted. No acute changes since last appointment.   Objective: General: No acute distress, AAOx3,  Presents today weightbearing in a cam boot. DP/PT pulses palpable 2/4, CRT < 3 sec to all digits.  Protective sensation intact. Motor function intact.  Right foot: Incision is well coapted without any evidence of dehiscence and a scar has formed. There is no surrounding erythema, ascending cellulitis, fluctuance, crepitus, malodor, drainage/purulence. There is minimal edema around the surgical site. There is no pain along the surgical site.  Arthrodesis site appears to be stable. There does appear to be of flexion contracture of the hallux IPJ, although flexible. There is also hammertoe contractures present with medial deviation of all the toes, most significant is the 2nd. There is tenderness overlying the dorsal PIPJ to the lesser digits and the tip of the hallux and the contracture. Exam grossly unchanged.  No other areas of tenderness to bilateral lower extremities.  No other open lesions or pre-ulcerative lesions.  No pain with calf compression, swelling, warmth, erythema.   Assessment and Plan:  Status post left foot surgery, doing well with no complications; hammertoes and hallux malleus  -Treatment options discussed including all alternatives, risks, and complications -I can today  had a long discussion the patient in regard to surgical versus conservative treatment. At this time she still wishes to continue with conservative treatment. I discussed with her hallux IPJ fusion and hammertoe repair of the lesser digits. I have him on discussion with her regards of given the amount of deformity that she has her toes are never can be perfectly straight however we will try get them as close as possible. I again reiterated risks and complications of the surgery. -The patient was upset about having to have another surgery. When I initially saw the patient she did have the hammertoe deformities as well however at that time she was more concerned about the bunion and the toes and she had elected not to have the hammertoe surgery at the same time. She is frustrated at this point with her insurance changing and having a higher deductible and a higher cost of her insurance monthly. She also last appointment had a discussion with me about cost of a nursing home situation with a parent. She states that the financial stress is higher and she wished that she had done all the surgery last year. I again discussed with her that we had a conversation that time in regards to doing the hammertoe the same time and we agreed at that time only to proceed with the bunion as that was the patella cause of her pain. Now that the bunion is fixed she has developed a hallux malleus and her hammertoes have become symptomatic. She did sustain a fall before the postop visit #1 after the initial surgery which may have resulted in a hallux malleus or  some other condition. I did call the patient after she left in regards to the cost of the surgery and insurance information. Upon asking if she had any further questions she replied she needed "10 million dollars".  Per Shanda Bumps, RN, the patient was also asking about my surgical skills at the time of her appointment today. A second opinion was offered. If she wishes to undergo surgery  with me she is scheduled. There is any questions or concerns before that to call the office.  Ovid Curd, DPM  -At this time discussed both conservative and surgical treatment options for hammertoe correction. Given the extent I do recommend hammertoe correction of all the toes although the second is no significant that is only correctable cause pressure to the other toes. I discussed the PIPJ arthrodesis with K wire fixation MPJ release of the lesser digits and a arthroplasty of the fifth toe. Hallux IPJ fusion. She wishes to proceed. -The incision placement as well as the postoperative course was discussed with the patient. I discussed risks of the surgery which include, but not limited to, infection, bleeding, pain, swelling, need for further surgery, delayed or nonhealing, painful or ugly scar, numbness or sensation changes, over/under correction, recurrence, transfer lesions, further deformity, hardware failure, DVT/PE, loss of toe/foot. Patient understands these risks and wishes to proceed with surgery. The surgical consent was reviewed with the patient all 3 pages were signed. No promises or guarantees were given to the outcome of the procedure. All questions were answered to the best of my ability. Before the surgery the patient was encouraged to call the office if there is any further questions. The surgery will be performed at the [facility] on an outpatient basis.  Ovid Curd, DPM

## 2015-08-19 ENCOUNTER — Telehealth: Payer: Self-pay | Admitting: *Deleted

## 2015-08-19 ENCOUNTER — Encounter: Payer: Self-pay | Admitting: *Deleted

## 2015-08-19 NOTE — Telephone Encounter (Signed)
I'm calling to get the name of her pain management doctor.  "It's Dr. Oneal Grout.  I think it's okay to tell you that.  We've been married for 37 years."  I called and left a message for Dr. Oneal Grout to advise about prescription for pain medication after surgery.  Dr. Ardelle Anton wants to prescribe Percocet 10/325.  Is this prescription okay or what would you recommend?  I called patient on her mobile and informed her I got pain management doctor's name from her husband, Dr. Oneal Grout.  Dr. Ardelle Anton wanted to make sure what he was going to prescribe is okay.  "Yes, that is fine.  He did that the last time too.  I have her phone number if you need it."  I think I have it, is it [phone].  "Yes, that's correct.  Dr. Ardelle Anton also wanted me to let you know if you would like to get a second opinion you can see Dr. Charlsie Merles or Dr. Al Corpus.  "No, that's not necessary.  I'm already scheduled, everything is on.  I'm okay with him.  Does he suggest I do this?"  No, he just said if you are still questionable you can do it.  "No, it's not needed.  I'm sure he consults with them about cases like this anyway."

## 2015-08-19 NOTE — Telephone Encounter (Signed)
-----   Message from Vivi Barrack, DPM sent at 08/16/2015  6:06 PM EST ----- Can you please send a note to her pain management specialist letting them know we will do Percocet 10/325 after her surgery and if there are any issues to let us know, or if they prefer another medication. Their phone number is [phone].  Also, just politely ask if she would like a second opinion we could get her in to see Dr. Charlsie Merles or [facility] for another opinion prior to surgery.   Thank you.

## 2015-08-19 NOTE — Telephone Encounter (Signed)
"  I'm calling from Dr. Migdalia Dk office you had called about Alicia Fernandez.  You said Dr. Ardelle Anton wants to give her Percocet 10/325 post-op.  That is okay with Dr. Oneal Grout.  Again, that is okay with Dr. Oneal Grout."

## 2015-08-26 DIAGNOSIS — M2021 Hallux rigidus, right foot: Secondary | ICD-10-CM | POA: Diagnosis not present

## 2015-08-26 DIAGNOSIS — M2041 Other hammer toe(s) (acquired), right foot: Secondary | ICD-10-CM | POA: Diagnosis not present

## 2015-08-26 DIAGNOSIS — M2042 Other hammer toe(s) (acquired), left foot: Secondary | ICD-10-CM | POA: Diagnosis not present

## 2015-08-28 ENCOUNTER — Ambulatory Visit (INDEPENDENT_AMBULATORY_CARE_PROVIDER_SITE_OTHER): Payer: BLUE CROSS/BLUE SHIELD | Admitting: Podiatry

## 2015-08-28 ENCOUNTER — Ambulatory Visit (INDEPENDENT_AMBULATORY_CARE_PROVIDER_SITE_OTHER): Payer: BLUE CROSS/BLUE SHIELD

## 2015-08-28 DIAGNOSIS — M2032 Hallux varus (acquired), left foot: Secondary | ICD-10-CM

## 2015-08-28 DIAGNOSIS — Z9889 Other specified postprocedural states: Secondary | ICD-10-CM

## 2015-08-28 DIAGNOSIS — M2042 Other hammer toe(s) (acquired), left foot: Secondary | ICD-10-CM | POA: Diagnosis not present

## 2015-08-28 DIAGNOSIS — M2450 Contracture, unspecified joint: Secondary | ICD-10-CM

## 2015-08-28 MED ORDER — IBUPROFEN 600 MG PO TABS: 600.0000 mg | ORAL_TABLET | Freq: Three times a day (TID) | ORAL | Status: DC | PRN

## 2015-08-28 MED ORDER — OXYCODONE-ACETAMINOPHEN 10-325 MG PO TABS: 1.0000 | ORAL_TABLET | Freq: Four times a day (QID) | ORAL | Status: DC | PRN

## 2015-08-28 MED ORDER — OXYCODONE HCL 10 MG PO TABS: 10.0000 mg | ORAL_TABLET | ORAL | Status: DC

## 2015-08-31 ENCOUNTER — Telehealth: Payer: Self-pay | Admitting: *Deleted

## 2015-08-31 NOTE — Telephone Encounter (Addendum)
Alicia Fernandez states the prescription reads Oxycodone  #30 one tablet every 4 hours, but the husband states the doctor said she could take 1 or 2 tablets every 4 hours.  I reviewed the pt's Medication listing and the prescription written on 08/28/2015 listed as Oxycodone  #30 one tablet every 4 hours, and informed Weston Brass.  08/31/2015-DR. WAGONER STATES HE told pt that she could take it 1 or 2 tablets every 4 hours.  I spoke with Weston Brass [facility] and gave the corrected orders.

## 2015-08-31 NOTE — Progress Notes (Signed)
Patient ID: Alicia Fernandez, female   DOB: 04/28/1956, 60 y.o.   MRN: [id number]  Subjective: Alicia Fernandez is a 60 y.o. is seen today in office s/p left Hallux IPJ fusion, hammertoe repair 2-5 preformed on 08/26/15. They state their pain is improving since she has increased her pain medication. She did call before her appointment stating that that she believes her K wire to the second toe had moved and she was having pain in her second toe. At that time she did increase her pain medicine which seems to help. She's tried stiffer. As possible. Denies any systemic complaints such as fevers, chills, nausea, vomiting. No calf pain, chest pain, shortness of breath.   Objective: General: No acute distress, AAOx3  DP/PT pulses palpable 2/4, CRT < 3 sec to all digits.  Protective sensation intact. Motor function intact.  Left foot: Incision is well coapted without any evidence of dehiscence and sutures intact. There is no surrounding erythema, ascending cellulitis, fluctuance, crepitus, malodor, drainage/purulence. There is mild edema around the surgical site. There is mild pain along the surgical site. Toes sit in a rectus position. No other areas of tenderness to bilateral lower extremities.  No other open lesions or pre-ulcerative lesions.  No pain with calf compression, swelling, warmth, erythema.   Assessment and Plan:  Status post left foot surgery, doing well with no complications   -Treatment options discussed including all alternatives, risks, and complications -X-rays were obtained and reviewed with the patient. Status post hallux IPJ fusion hammertoe repair with hardware intact without any evidence of breakage or loosening. -Continue nonweightbearing. She will have a short cam boot to help with her other back and hip issues. This was dispensed today. -Antibiotic ointment was applied followed by dry sterile dressing. -Ice/elevation -Pain medication as needed. -Monitor for any clinical  signs or symptoms of infection and DVT/PE and directed to call the office immediately should any occur or go to the ER. -Follow-up as scheduled or sooner if any problems arise. In the meantime, encouraged to call the office with any questions, concerns, change in symptoms.   Ovid Curd, DPM

## 2015-09-04 ENCOUNTER — Other Ambulatory Visit: Payer: Self-pay

## 2015-09-11 ENCOUNTER — Ambulatory Visit (INDEPENDENT_AMBULATORY_CARE_PROVIDER_SITE_OTHER): Payer: BLUE CROSS/BLUE SHIELD | Admitting: Podiatry

## 2015-09-11 DIAGNOSIS — Z9889 Other specified postprocedural states: Secondary | ICD-10-CM

## 2015-09-11 DIAGNOSIS — M2032 Hallux varus (acquired), left foot: Secondary | ICD-10-CM

## 2015-09-11 DIAGNOSIS — M2012 Hallux valgus (acquired), left foot: Secondary | ICD-10-CM

## 2015-09-11 DIAGNOSIS — Z981 Arthrodesis status: Secondary | ICD-10-CM

## 2015-09-11 DIAGNOSIS — M2042 Other hammer toe(s) (acquired), left foot: Secondary | ICD-10-CM

## 2015-09-11 MED ORDER — OXYCODONE HCL 10 MG PO TABS: 10.0000 mg | ORAL_TABLET | ORAL | Status: DC

## 2015-09-14 NOTE — Progress Notes (Signed)
Patient ID: Alicia Fernandez, female   DOB: June 06, 1956, 60 y.o.   MRN: [id number]  Subjective: Alicia Fernandez is a 60 y.o. is seen today in office s/p left Hallux IPJ fusion, hammertoe repair 2-5 preformed on 08/26/15. She states that her pain is improving compared to last appointment. She is decreasing of oxycodone she is been taking. She has remained nonweightbearing as possible and CAM boot using a knee scooter. She presents today for suture removal. Denies any systemic complaints such as fevers, chills, nausea, vomiting. No calf pain, chest pain, shortness of breath.   Objective: General: No acute distress, AAOx3  DP/PT pulses palpable 2/4, CRT < 3 sec to all digits.  Protective sensation intact. Motor function intact.  Left foot: Incision is well coapted without any evidence of dehiscence and sutures intact. There is no surrounding erythema, ascending cellulitis, fluctuance, crepitus, malodor, drainage/purulence. There is mild edema around the surgical site. There is mild pain along the surgical site. Toes sit in a rectus position. No other areas of tenderness to bilateral lower extremities.  No other open lesions or pre-ulcerative lesions.  No pain with calf compression, swelling, warmth, erythema.   Assessment and Plan:  Status post left foot surgery, doing well with no complications   -Treatment options discussed including all alternatives, risks, and complications -X-rays were obtained and reviewed with the patient. Status post hallux IPJ fusion hammertoe repair with hardware intact without any evidence of breakage or loosening. There is mild distraction across the IPJ fusion site. X-ray findings were discussed with the patient. -Sutures they were removed without complications or bleeding. Antibiotic ointment was applied followed by a dressing. She can change his bandage at home. Do no get the pin sites wet.  -Continue nonweightbearing. Continue CAM boot at all times, even at  night -Ice/elevation -Pain medication as needed. Refilled oxycodone  -Monitor for any clinical signs or symptoms of infection and DVT/PE and directed to call the office immediately should any occur or go to the ER. -Follow-up as scheduled or sooner if any problems arise. In the meantime, encouraged to call the office with any questions, concerns, change in symptoms.   Ovid Curd, DPM

## 2015-09-30 ENCOUNTER — Ambulatory Visit (INDEPENDENT_AMBULATORY_CARE_PROVIDER_SITE_OTHER): Payer: BLUE CROSS/BLUE SHIELD | Admitting: Podiatry

## 2015-09-30 ENCOUNTER — Encounter: Payer: Self-pay | Admitting: Podiatry

## 2015-09-30 ENCOUNTER — Ambulatory Visit (HOSPITAL_BASED_OUTPATIENT_CLINIC_OR_DEPARTMENT_OTHER)
Admission: RE | Admit: 2015-09-30 | Discharge: 2015-09-30 | Disposition: A | Discharge status: Home / Self Care | Payer: BLUE CROSS/BLUE SHIELD | Source: Ambulatory Visit | Attending: Podiatry | Admitting: Podiatry

## 2015-09-30 VITALS — BP 122/84 | HR 86 | Resp 18

## 2015-09-30 DIAGNOSIS — Z9889 Other specified postprocedural states: Secondary | ICD-10-CM

## 2015-09-30 DIAGNOSIS — M2042 Other hammer toe(s) (acquired), left foot: Secondary | ICD-10-CM | POA: Insufficient documentation

## 2015-09-30 DIAGNOSIS — M79673 Pain in unspecified foot: Secondary | ICD-10-CM

## 2015-09-30 NOTE — Progress Notes (Signed)
Patient ID: Alicia Fernandez, female   DOB: 08/24/1955, 60 y.o.   MRN: [id number]  Subjective: Alicia Fernandez is a 60 y.o. is seen today in office s/p left Hallux IPJ fusion, hammertoe repair 2-5 preformed on 08/26/15. It's that overall she is doing better and she is decreased edema pain medicine she has been taking. She gets some occasional sharp shooting pains into her toes 2 through 5 however she is not expecting this to her big toe. She has tried to stay nonweightbearing for possible and she's been wearing a cam boot.  Denies any systemic complaints such as fevers, chills, nausea, vomiting. No calf pain, chest pain, shortness of breath.   Objective: General: No acute distress, AAOx3  DP/PT pulses palpable 2/4, CRT < 3 sec to all digits.  Protective sensation intact. Motor function intact.  Left foot: Incision is well coapted without any evidence of dehiscence and scars are formed. There is no surrounding erythema, ascending cellulitis, fluctuance, crepitus, malodor, drainage/purulence. There is mild edema around the surgical site, however decreased. There is decreased pain along the surgical site. Toes sit in a rectus position. K-wires intact without any drainage or redness.  No other areas of tenderness to bilateral lower extremities.  No other open lesions or pre-ulcerative lesions.  No pain with calf compression, swelling, warmth, erythema.   Assessment and Plan:  Status post left foot surgery, doing well with no complications   -Treatment options discussed including all alternatives, risks, and complications  -X-rays were ordered and reviewed today. -At this appointment she is requesting the wires be removed. Although it's only been 5 weeks however toes appear stable to have the wires removed. The wires were cleaned and they're removed in total today. Equinus was applied followed by dressing. She is of the dressing off tomorrow and start to shower. -She brought a surgical shoe with  her today she started transition of this. She Started Transition to Partial Weightbearing As Tolerated in the Surgical Shoe. Riley LamDouglas but Was Also Dispensed for the Second Toe to Help Hold It in a Rectus Position Although They Are a Rectus Position Today. -Monitor for any clinical signs or symptoms of infection and DVT/PE and directed to call the office immediately should any occur or go to the ER. -Follow-up as scheduled or sooner if any problems arise. In the meantime, encouraged to call the office with any questions, concerns, change in symptoms.   Ovid CurdMatthew Almina Schul, DPM

## 2015-10-21 ENCOUNTER — Ambulatory Visit: Payer: BLUE CROSS/BLUE SHIELD | Admitting: Podiatry

## 2015-10-23 ENCOUNTER — Encounter: Payer: Self-pay | Admitting: Podiatry

## 2015-10-23 ENCOUNTER — Ambulatory Visit: Payer: BLUE CROSS/BLUE SHIELD | Admitting: Podiatry

## 2015-10-23 ENCOUNTER — Ambulatory Visit (INDEPENDENT_AMBULATORY_CARE_PROVIDER_SITE_OTHER): Payer: BLUE CROSS/BLUE SHIELD | Admitting: Podiatry

## 2015-10-23 ENCOUNTER — Ambulatory Visit (INDEPENDENT_AMBULATORY_CARE_PROVIDER_SITE_OTHER): Payer: BLUE CROSS/BLUE SHIELD

## 2015-10-23 VITALS — BP 107/76 | HR 60 | Resp 12

## 2015-10-23 DIAGNOSIS — M2042 Other hammer toe(s) (acquired), left foot: Secondary | ICD-10-CM | POA: Diagnosis not present

## 2015-10-23 DIAGNOSIS — Z9889 Other specified postprocedural states: Secondary | ICD-10-CM

## 2015-10-23 DIAGNOSIS — M2032 Hallux varus (acquired), left foot: Secondary | ICD-10-CM

## 2015-10-26 DIAGNOSIS — M2042 Other hammer toe(s) (acquired), left foot: Secondary | ICD-10-CM

## 2015-10-28 DIAGNOSIS — Z9889 Other specified postprocedural states: Secondary | ICD-10-CM | POA: Insufficient documentation

## 2015-10-28 NOTE — Progress Notes (Signed)
Patient ID: Alicia Fernandez, female   DOB: [birth year], 60 y.o.   MRN: [id number]  Subjective: Alicia Fernandez is a 60 y.o. is seen today in office s/p left Hallux IPJ fusion, hammertoe repair 2-5 preformed on 08/26/15. Recently she has returned to regular shoe. She doesn't toes is somewhat swollen and the second toenail are to surgically drift back over towards the big toe. She states the pain has improved and she is decreased pain medicine she's been taking from the surgery. She denies any recent injury or trauma. No tingling or numbness.  Denies any systemic complaints such as fevers, chills, nausea, vomiting. No calf pain, chest pain, shortness of breath.   Objective: General: No acute distress, AAOx3  DP/PT pulses palpable 2/4, CRT < 3 sec to all digits.  Protective sensation intact. Motor function intact.  Left foot: Incision is well coapted without any evidence of dehiscence and scars are formed. There is no surrounding erythema, ascending cellulitis, fluctuance, crepitus, malodor, drainage/purulence. There is mild edema to the toes and there is no associate erythema or increase in warmth. The second toe as well as the other toes have drifted somewhat medially. Range of motion intact to the MPJs. Hallux sits in a rectus position without any pain. No other areas of tenderness to bilateral lower extremities.  No other open lesions or pre-ulcerative lesions.  No pain with calf compression, swelling, warmth, erythema.   Assessment and Plan:  Status post left foot surgery, doing well with no complications   -Treatment options discussed including all alternatives, risks, and complications  -X-rays were ordered and reviewed today. -Discussed the x-ray findings as well as the toe deviation. This time the actual deviation the toe is not painful. She is more concerned about the way it looks. For now we'll continue the regular shoe. If in the future she without further surgery would likely need  metatarsal osteotomies versus resection. -Continue ice and elevation as well as compression sock. -Increase activity as tolerated. -Follow-up as scheduled or sooner if any problems arise. In the meantime, encouraged to call the office with any questions, concerns, change in symptoms.   Ovid CurdMatthew Renate Danh, DPM

## 2015-11-16 ENCOUNTER — Other Ambulatory Visit: Payer: Self-pay | Admitting: Obstetrics & Gynecology

## 2015-11-16 NOTE — Telephone Encounter (Signed)
Medication refill request: Wellbutrin XL 150 mg  Last AEX:  10/21/14 with SM Next AEX: 01/01/2016 with SM Last MMG (if hormonal medication request): n/a Refill authorized: #[id number] rf

## 2015-11-20 ENCOUNTER — Ambulatory Visit: Payer: BLUE CROSS/BLUE SHIELD | Admitting: Obstetrics & Gynecology

## 2015-11-29 ENCOUNTER — Other Ambulatory Visit: Payer: Self-pay | Admitting: Obstetrics & Gynecology

## 2015-11-30 NOTE — Telephone Encounter (Signed)
Medication refill request: Premarin  Last AEX:  10-21-14 Next AEX: 01-25-16 Last MMG (if hormonal medication request): 09-24-15 WNL Refill authorized: please advise

## 2015-12-04 ENCOUNTER — Encounter: Payer: Self-pay | Admitting: Podiatry

## 2015-12-04 ENCOUNTER — Ambulatory Visit (INDEPENDENT_AMBULATORY_CARE_PROVIDER_SITE_OTHER): Payer: BLUE CROSS/BLUE SHIELD | Admitting: Podiatry

## 2015-12-04 VITALS — BP 111/76 | HR 88 | Resp 16

## 2015-12-04 DIAGNOSIS — Z9889 Other specified postprocedural states: Secondary | ICD-10-CM

## 2015-12-04 DIAGNOSIS — M2042 Other hammer toe(s) (acquired), left foot: Secondary | ICD-10-CM

## 2015-12-04 DIAGNOSIS — M2032 Hallux varus (acquired), left foot: Secondary | ICD-10-CM

## 2015-12-09 NOTE — Progress Notes (Signed)
Patient ID: Alicia Fernandez, female   DOB: 03-14-1956, 60 y.o.   MRN: [id number]  Subjective: Alicia Fernandez is a 60 y.o. is seen today in office s/p left Hallux IPJ fusion, hammertoe repair 2-5 preformed on 08/26/15. He is been able to go back into a regular sandal type shoe. She states the swelling and pain has improved. She says of the second toe has drifted towards the big toe but has not worsened since last appointment she wears a space between the toes which to help. Lysis of the foot at times. She states that she does is good feels good. Denies any systemic complaints such as fevers, chills, nausea, vomiting. No calf pain, chest pain, shortness of breath.   Objective: General: No acute distress, AAOx3  DP/PT pulses palpable 2/4, CRT < 3 sec to all digits.  Protective sensation intact. Motor function intact.  Left foot: Incision is well coapted without any evidence of dehiscence and scars are formed. There is no surrounding erythema, ascending cellulitis, fluctuance, crepitus, malodor, drainage/purulence. There is decreased edema to the toes and there is no associated erythema or increase in warmth. The second toe as well as the other toes have drifted medially. Range of motion intact to the MPJs. Hallux sits in a rectus position without any pain. No other areas of tenderness to bilateral lower extremities.  No other open lesions or pre-ulcerative lesions.  No pain with calf compression, swelling, warmth, erythema.   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   -Treatment options discussed including all alternatives, risks, and complications -At this time I discussed with her treatment options for the lesser toes. They're not causing discomfort she is just concerned about the way will. Stressed that if she in the future desired further surgical intervention would likely need to have metatarsal osteotomies in order to help correct  the toes however I recommend holding off on doing that now. Continue toe separator and splint to help. I did dispense a little toe separators for her as well as a splint the toe see if this will help. Continue ice as needed. I will see her back in 8 weeks or sooner if any issues are to arise.  Ovid CurdMatthew Jessah Danser, DPM

## 2015-12-19 ENCOUNTER — Other Ambulatory Visit: Payer: Self-pay | Admitting: Obstetrics & Gynecology

## 2015-12-21 NOTE — Telephone Encounter (Signed)
Medication refill request: Vaniqa Last AEX:  10/21/14 SM Next AEX: 01/25/16 SM Last MMG (if hormonal medication request): 09/24/15 BIRADS1:neg Refill authorized: please advise.

## 2016-01-01 ENCOUNTER — Ambulatory Visit: Payer: 59 | Admitting: Obstetrics & Gynecology

## 2016-01-25 ENCOUNTER — Encounter: Payer: Self-pay | Admitting: Obstetrics & Gynecology

## 2016-01-25 ENCOUNTER — Ambulatory Visit (INDEPENDENT_AMBULATORY_CARE_PROVIDER_SITE_OTHER): Payer: BLUE CROSS/BLUE SHIELD | Admitting: Obstetrics & Gynecology

## 2016-01-25 VITALS — BP 110/78 | HR 70 | Resp 18 | Ht 59.5 in | Wt 126.6 lb

## 2016-01-25 DIAGNOSIS — R3915 Urgency of urination: Secondary | ICD-10-CM | POA: Diagnosis not present

## 2016-01-25 DIAGNOSIS — Z0000 Encounter for general adult medical examination without abnormal findings: Secondary | ICD-10-CM | POA: Diagnosis not present

## 2016-01-25 DIAGNOSIS — Z01419 Encounter for gynecological examination (general) (routine) without abnormal findings: Secondary | ICD-10-CM | POA: Diagnosis not present

## 2016-01-25 DIAGNOSIS — Z800 Family history of malignant neoplasm of digestive organs: Secondary | ICD-10-CM | POA: Diagnosis not present

## 2016-01-25 LAB — POCT URINALYSIS DIPSTICK
BILIRUBIN UA: NEGATIVE
Blood, UA: NEGATIVE
GLUCOSE UA: NEGATIVE
KETONES UA: NEGATIVE
LEUKOCYTES UA: NEGATIVE
NITRITE UA: NEGATIVE
Protein, UA: NEGATIVE
Urobilinogen, UA: NEGATIVE
pH, UA: 5

## 2016-01-25 LAB — TSH: TSH: 5.75 m[IU]/L — AB

## 2016-01-25 MED ORDER — DULOXETINE HCL 30 MG PO CPEP: ORAL_CAPSULE | ORAL | Status: AC

## 2016-01-25 MED ORDER — ESTROGENS, CONJUGATED 0.625 MG/GM VA CREA: TOPICAL_CREAM | VAGINAL | Status: AC

## 2016-01-25 MED ORDER — TERCONAZOLE 0.4 % VA CREA: 1.0000 | TOPICAL_CREAM | Freq: Every day | VAGINAL | Status: AC
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
## 2016-01-25 NOTE — Patient Instructions (Signed)
For one week, you will keep taking your Wellbutrin 150mg  XL daily.  Also, you will add 30mg  cymbalta daily.  After one week, you will increase the cymbalta to 60mg  daily and decreased the wellbutrin to every other day for one week.    Then you will be off the Wellbutrin and on 60mg  of cymbalta.

## 2016-01-26 LAB — LIPID PANEL
Cholesterol: 190 mg/dL (ref 125–200)
HDL: 91 mg/dL (ref >=46)
LDL CALC: 81 mg/dL (ref <130)
TRIGLYCERIDES: 90 mg/dL (ref <150)
Total CHOL/HDL Ratio: 2.1 Ratio (ref <=5.0)
VLDL: 18 mg/dL (ref <30)

## 2016-01-26 LAB — URINE CULTURE
Colony Count: NO GROWTH
Organism ID, Bacteria: NO GROWTH

## 2016-01-26 LAB — VITAMIN D 25 HYDROXY (VIT D DEFICIENCY, FRACTURES): Vit D, 25-Hydroxy: 24 ng/mL — ABNORMAL LOW (ref 30–100)

## 2016-01-27 ENCOUNTER — Telehealth: Payer: Self-pay | Admitting: *Deleted

## 2016-01-27 NOTE — Telephone Encounter (Signed)
Spoke with patient. Patient was seen on 01/25/2016 with Dr.Miller (see OV note below). Reports she started taking Cymblata 30 mg yesterday. Last night she was extremely tired and had a headache. She went to bed at 8:30 pm and was having trouble waking up this morning at 10 am. "I just felt so tired and weird." Reports she still has a mild headache that will not go away. Denies any vision changes or dizziness. "I feel like my head is foggy." Advised patient she will need to discontinue taking Cymbalta and can continue her Wellbutrin 150 XL at this time. She is aware Dr.Miller is out of the office today and would like to wait until tomorrow for me to speak with Dr.Miller upon her return to the office. Advised I will speak with Dr.Miller regarding symptoms and return call. She is agreeable.  P: Mammogram yearly pap smear not indicated Will check with Dr. Marlane Hatchermagod's office about whether screening colonoscopy is due Premarin cream vaginally twice weekly. rx to pharmacy.  Terazol rx to pharmacy. #1 tube with 1RF given Lipids, TSH, Vit D obtained today Urine culture pending Will cross taper Wellbutrin 150XL daily and Cymbalta 30mg . Within three weeks, she will be off the Wellbutrin and on Cymbalta 60mg  daily. Hopefully this will help with chronic pain. Directions given. Rx to pharmacy. Side effects discused. Will call with any symptoms/concerns. return annually or prn

## 2016-01-27 NOTE — Telephone Encounter (Signed)
Patient states she is having side effects with Celexa. States she is extremely tired and a bad headache since she started on it.

## 2016-01-29 NOTE — Telephone Encounter (Signed)
Agree with stopping the cymbalta.  Just keep taking the Wellbutrin 150mg  XL daily and make sure symptoms resolve.  Can always increase the Wellbutrin dosage to 300mg  XL.  I'd like to make sure that all of her symptoms are resolved before making any additional medication changes.  Thanks.

## 2016-01-29 NOTE — Telephone Encounter (Addendum)
Spoke with patient. Advised of message as seen below from Dr.Miller. She states that since stopping Cymbalta she is feeling much better. She does not desire to increase her Wellbutrin prescription at this time. Reports she is doing well on her 150 mg dosage. She will return call to the office if she would like to increase her dosage. Patient is asking about her recent lab results. Advised I will have Dr.Miller review results and return call. She is also asking when she is due to have another colonoscopy. "Dr.Miller said she was going to check with Dr.Magod about when I need to have another colonoscopy since my brother had colon cancer." Advised I will contact Dr.Magod's office and return call with further information.  Spoke with Dr.Magod's office. Patient's last colonoscopy was performed in 2014 she is due in 2019. Will advise patient when returning call with lab results.

## 2016-01-29 NOTE — Telephone Encounter (Signed)
Left message to call Rollen Selders at [phone]. 

## 2016-01-29 NOTE — Telephone Encounter (Signed)
Spoke with patient. Advised of message as seen below from Alicia Fernandez. Patient is agreeable and verbalizes understanding. 1 month lab recheck scheduled for 02/29/2016 at 1:30 pm. She is agreeable to date and time. Notified she is due for her next colonoscopy in 2019. She is agreeable.  Notes Recorded by Alicia BearsMary S Miller, MD on 01/29/2016 at 12:06 PM Inform Vit D is a little low at 24. Should add 2000 IU daily. Lipids are normal. TSH is mildly elevated indicating mildly decreased thyroid function. Repeat in one month. TSH and panel. Inform urine culture was negative.   Colonoscopy not due until 2019 per Dr. Marlane HatcherMagod's office.  Routing to provider for final review. Patient agreeable to disposition. Will close encounter.

## 2016-02-01 ENCOUNTER — Encounter: Payer: Self-pay | Admitting: Podiatry

## 2016-02-01 ENCOUNTER — Ambulatory Visit (INDEPENDENT_AMBULATORY_CARE_PROVIDER_SITE_OTHER): Payer: BLUE CROSS/BLUE SHIELD | Admitting: Podiatry

## 2016-02-01 ENCOUNTER — Ambulatory Visit (INDEPENDENT_AMBULATORY_CARE_PROVIDER_SITE_OTHER): Payer: BLUE CROSS/BLUE SHIELD

## 2016-02-01 DIAGNOSIS — M2042 Other hammer toe(s) (acquired), left foot: Secondary | ICD-10-CM

## 2016-02-01 DIAGNOSIS — M2450 Contracture, unspecified joint: Secondary | ICD-10-CM

## 2016-02-02 NOTE — Progress Notes (Signed)
Patient ID: Alicia Fernandez, female   DOB: 1956-02-10, 60 y.o.   MRN: [id number]  Subjective: Alicia Fernandez is a 60 y.o. is seen today in office s/p left Hallux IPJ fusion, hammertoe repair 2-5 preformed on 08/26/15. She states that her lesser ptosis with history of towards the big toe and she feels of the toe suddenly tripped and works as well. She does have some difficulty with walking due to her toes. She denies any recent injury trauma. She is wearing a regular shoe and she presents today wearing an open toed healed shoe. Denies any systemic complaints such as fevers, chills, nausea, vomiting. No calf pain, chest pain, shortness of breath.   Objective: General: No acute distress, AAOx3  DP/PT pulses palpable 2/4, CRT < 3 sec to all digits.  Protective sensation intact. Motor function intact.  Left foot: Incision is well coapted without any evidence of dehiscence and scars are formed. There is no surrounding erythema, ascending cellulitis, fluctuance, crepitus, malodor, drainage/purulence. The lesser digits to sit in an abducted position. The hallux is rectus. There is no significant tenderness to palpation however she does feel that this is painful with mostly with walking in shoes. She is working on change in her shoes without much relief. Upon gait evaluation she is walking in an abducted position with her foot. There is no significant swelling at this time however she does that it swells at times. No other areas of tenderness to bilateral lower extremities.  No other open lesions or pre-ulcerative lesions.  No pain with calf compression, swelling, warmth, erythema.   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   -Treatment options discussed including all alternatives, risks, and complications -She's working reviewed with the patient. Hallux is in rectus position however at the first metatarsal cuneiform joint is worth believe the  deformity is coming from. She has also had reoccurrence of the digital deformities. -This is a difficult situation. Given she's had 2 surgeries on this foot down she's had some difficulty walking unable to try an orthotic, custom with her to see physical help improve her ambulation. I will have her follow-up with Betha for casting to see what she thinks about an orthotic for her. I'm hoping orthotics will help control the big toe from drifting in an abducted position.  -In the future she desires further surgical intervention discussed with her pain metatarsal head resection. -I will follow-up with her after Fenton FoyBetha she was directed to call any questions or concerns or any changes symptoms in the meantime.  Ovid CurdMatthew Wagoner, DPM

## 2016-02-22 ENCOUNTER — Emergency Department
Admission: EM | Admit: 2016-02-22 | Discharge: 2016-02-22 | Disposition: A | Discharge status: Home / Self Care | Payer: BLUE CROSS/BLUE SHIELD | Source: Home / Self Care | Attending: Family Medicine | Admitting: Family Medicine

## 2016-02-22 ENCOUNTER — Encounter: Payer: Self-pay | Admitting: *Deleted

## 2016-02-22 ENCOUNTER — Other Ambulatory Visit: Payer: BLUE CROSS/BLUE SHIELD | Admitting: *Deleted

## 2016-02-22 DIAGNOSIS — J069 Acute upper respiratory infection, unspecified: Secondary | ICD-10-CM | POA: Diagnosis not present

## 2016-02-22 DIAGNOSIS — H65192 Other acute nonsuppurative otitis media, left ear: Secondary | ICD-10-CM

## 2016-02-22 DIAGNOSIS — M2450 Contracture, unspecified joint: Secondary | ICD-10-CM

## 2016-02-22 MED ORDER — AZITHROMYCIN 250 MG PO TABS: 250.0000 mg | ORAL_TABLET | Freq: Every day | ORAL | 0 refills | Status: DC

## 2016-02-22 NOTE — ED Triage Notes (Signed)
Pt c/o 3 days of productive cough and congestion and now facial pain on the right side. She awoke this AM with tongue swelling. Throughout the day it has improved. She had a cystoscopy performed 3 days ago, taken 3 days of Macrobid, pyridium and oxybutynin, unsure if reaction to one of these medications.

## 2016-02-22 NOTE — Discharge Instructions (Signed)
°  Please be sure to tell your urologist about the reaction you had with the medications you were started on and please let them know you are now being started on Azithromycin for an ear infection and upper respiratory infection as they may be okay with you stopping the macrobid, or they may want to start you on another antibiotic for your bladder.
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
## 2016-02-29 ENCOUNTER — Other Ambulatory Visit: Payer: Self-pay | Admitting: Obstetrics & Gynecology

## 2016-02-29 ENCOUNTER — Other Ambulatory Visit (INDEPENDENT_AMBULATORY_CARE_PROVIDER_SITE_OTHER): Payer: BLUE CROSS/BLUE SHIELD

## 2016-02-29 DIAGNOSIS — Z0000 Encounter for general adult medical examination without abnormal findings: Secondary | ICD-10-CM

## 2016-02-29 DIAGNOSIS — R7989 Other specified abnormal findings of blood chemistry: Secondary | ICD-10-CM

## 2016-03-02 LAB — THYROID PANEL WITH TSH
Free Thyroxine Index: 2.3 (ref 1.4–3.8)
T3 Uptake: 29 % (ref 22–35)
T4 TOTAL: 8.1 ug/dL (ref 4.5–12.0)
TSH: 3 m[IU]/L

## 2016-03-11 ENCOUNTER — Encounter: Payer: Self-pay | Admitting: *Deleted

## 2016-03-11 NOTE — Progress Notes (Signed)
   DOS 08-26-15  Hammer toe 2nd, 3rd, 4th, 5th toe left and hallux IPJ fusion left

## 2016-03-28 ENCOUNTER — Other Ambulatory Visit: Payer: BLUE CROSS/BLUE SHIELD | Admitting: *Deleted

## 2016-03-31 ENCOUNTER — Telehealth: Payer: Self-pay | Admitting: Obstetrics & Gynecology

## 2016-03-31 NOTE — Telephone Encounter (Signed)
[facility], [facility] called requesting directions for Premarin.

## 2016-03-31 NOTE — Telephone Encounter (Signed)
[facility]. [facility] Lurena [facility]becca [facility]. Pharmacy calling to verify directions for Premarin Vaginal cream. 0.5mg  corrected to 0.5grams, vaginally twice weekly.   Dr. Hyacinth MeekerMiller, do you agree with the above changes?

## 2016-03-31 NOTE — Telephone Encounter (Signed)
Yes.  Agree with changes.  Thank you.  Ok to close encounter.

## 2016-04-04 ENCOUNTER — Encounter: Payer: Self-pay | Admitting: Emergency Medicine

## 2016-04-04 ENCOUNTER — Emergency Department
Admission: EM | Admit: 2016-04-04 | Discharge: 2016-04-04 | Disposition: A | Discharge status: Home / Self Care | Payer: BLUE CROSS/BLUE SHIELD | Source: Home / Self Care | Attending: Family Medicine | Admitting: Family Medicine

## 2016-04-04 ENCOUNTER — Emergency Department (INDEPENDENT_AMBULATORY_CARE_PROVIDER_SITE_OTHER): Payer: BLUE CROSS/BLUE SHIELD

## 2016-04-04 DIAGNOSIS — J069 Acute upper respiratory infection, unspecified: Secondary | ICD-10-CM | POA: Diagnosis not present

## 2016-04-04 DIAGNOSIS — R05 Cough: Secondary | ICD-10-CM

## 2016-04-04 DIAGNOSIS — B9789 Other viral agents as the cause of diseases classified elsewhere: Principal | ICD-10-CM

## 2016-04-04 LAB — POCT INFLUENZA A/B
Influenza A, POC: NEGATIVE
Influenza B, POC: NEGATIVE

## 2016-04-04 MED ORDER — ALBUTEROL SULFATE HFA 108 (90 BASE) MCG/ACT IN AERS: 1.0000 | INHALATION_SPRAY | Freq: Four times a day (QID) | RESPIRATORY_TRACT | 0 refills | Status: AC | PRN
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
## 2016-04-12 ENCOUNTER — Other Ambulatory Visit: Payer: Self-pay | Admitting: Obstetrics & Gynecology

## 2016-04-12 NOTE — Telephone Encounter (Signed)
Medication refill request: premarin vag cream  Last AEX:  01/1016 SM  Next AEX: 04/21/17 SM  Last MMG (if hormonal medication request): 09/24/15 [id number]:neg  [facility]

## 2016-05-09 ENCOUNTER — Encounter: Payer: Self-pay | Admitting: Podiatry

## 2016-05-09 ENCOUNTER — Ambulatory Visit: Payer: BLUE CROSS/BLUE SHIELD | Admitting: Podiatry

## 2016-05-09 ENCOUNTER — Ambulatory Visit (INDEPENDENT_AMBULATORY_CARE_PROVIDER_SITE_OTHER): Payer: BLUE CROSS/BLUE SHIELD | Admitting: Podiatry

## 2016-05-09 DIAGNOSIS — Z9889 Other specified postprocedural states: Secondary | ICD-10-CM

## 2016-05-09 DIAGNOSIS — M2450 Contracture, unspecified joint: Secondary | ICD-10-CM

## 2016-05-10 ENCOUNTER — Telehealth: Payer: Self-pay | Admitting: *Deleted

## 2016-05-10 MED ORDER — NONFORMULARY OR COMPOUNDED ITEM: 2 refills | Status: AC

## 2016-05-10 NOTE — Telephone Encounter (Signed)
Dr. Evans ordered Achilles Tendonitis Cream. Faxed to [facility]. 
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
## 2016-09-19 ENCOUNTER — Other Ambulatory Visit: Payer: Self-pay | Admitting: Gastroenterology

## 2016-09-19 DIAGNOSIS — K7469 Other cirrhosis of liver: Secondary | ICD-10-CM

## 2016-09-26 ENCOUNTER — Other Ambulatory Visit: Payer: BLUE CROSS/BLUE SHIELD

## 2016-09-27 ENCOUNTER — Ambulatory Visit
Admission: RE | Admit: 2016-09-27 | Discharge: 2016-09-27 | Disposition: A | Discharge status: Home / Self Care | Payer: BLUE CROSS/BLUE SHIELD | Source: Ambulatory Visit | Attending: Gastroenterology | Admitting: Gastroenterology

## 2016-09-27 DIAGNOSIS — K7469 Other cirrhosis of liver: Secondary | ICD-10-CM

## 2016-10-28 ENCOUNTER — Other Ambulatory Visit: Payer: Self-pay | Admitting: Obstetrics & Gynecology

## 2016-10-28 NOTE — Telephone Encounter (Signed)
Patient is asking for a refill of Vaniqa. [facility] mail order pharmacy. [phone]

## 2016-10-28 NOTE — Telephone Encounter (Signed)
Medication refill request: Vaniqa 13.9% Last AEX:  01/25/16 SM Next AEX: 04/21/17  Last MMG (if hormonal medication request): 09/24/2015 BIRADS 1 negative Refill authorized: please advise

## 2016-10-29 MED ORDER — EFLORNITHINE HCL 13.9 % EX CREA
1.0000 | TOPICAL_CREAM | Freq: Two times a day (BID) | CUTANEOUS | 5 refills | Status: AC
Start: 2016-10-29 — End: ?

## 2017-04-21 ENCOUNTER — Ambulatory Visit: Payer: BLUE CROSS/BLUE SHIELD | Admitting: Obstetrics & Gynecology

## 2018-02-16 IMAGING — US US ABDOMEN LIMITED
1 series · 14 of 25 positions shown · non-contrast
Comparison: 10/26/2012.

CLINICAL DATA: Cirrhosis.

EXAM:
US ABDOMEN LIMITED - RIGHT UPPER QUADRANT

[Series 1: us abdomen limited · 0.19mm/px · 14 of 41 slices shown]
[im 1/41]
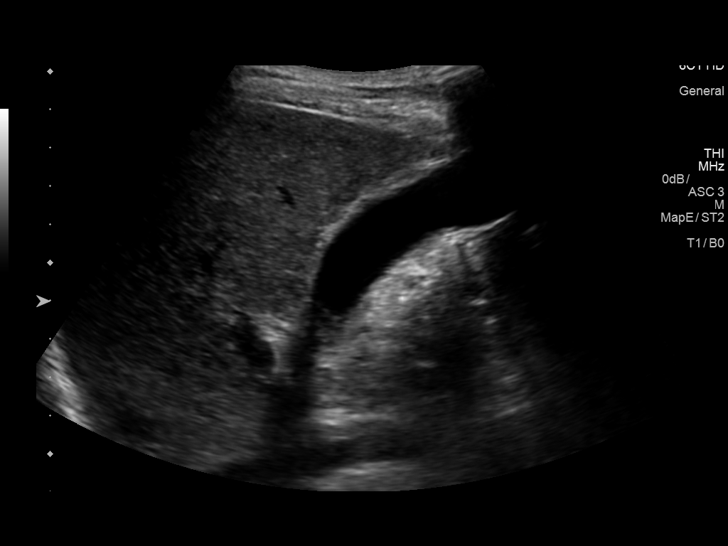
[im 4/41]
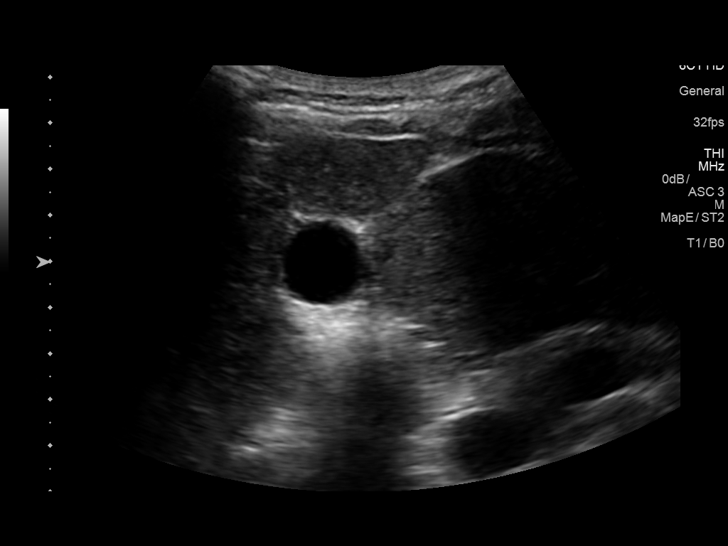
[im 7/41]
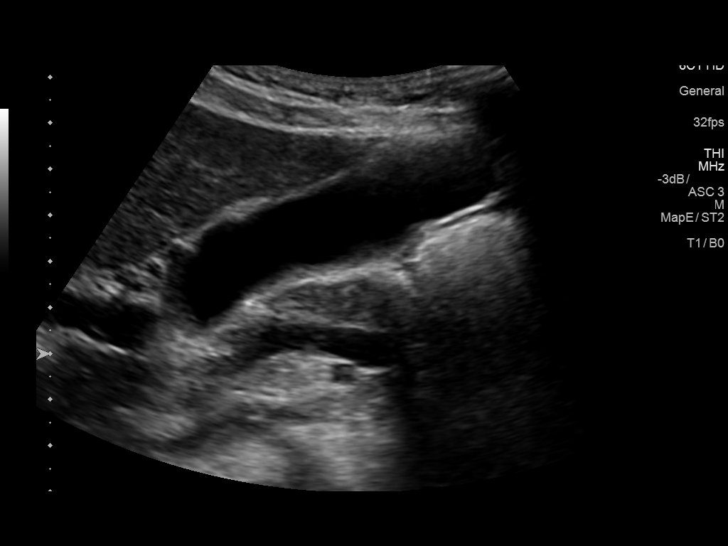
[im 11/41]
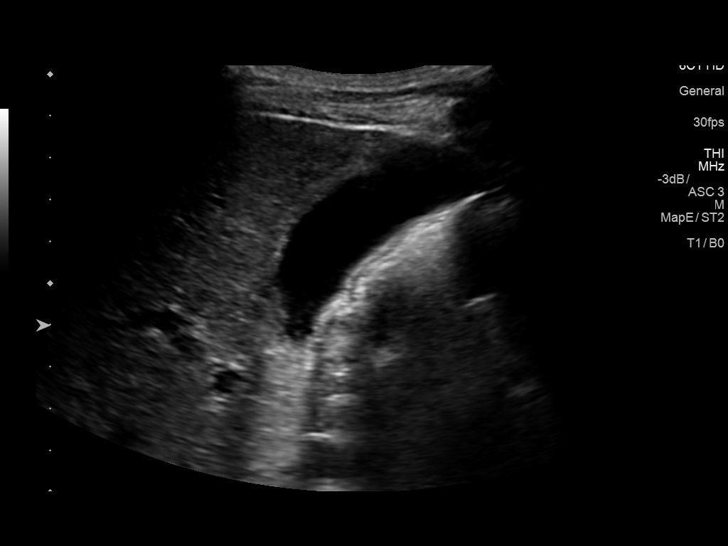
[im 14/41]
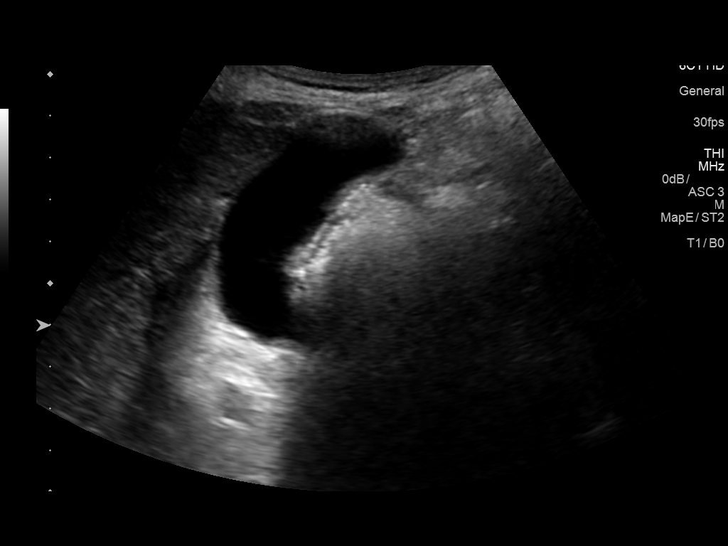
[im 16/41]
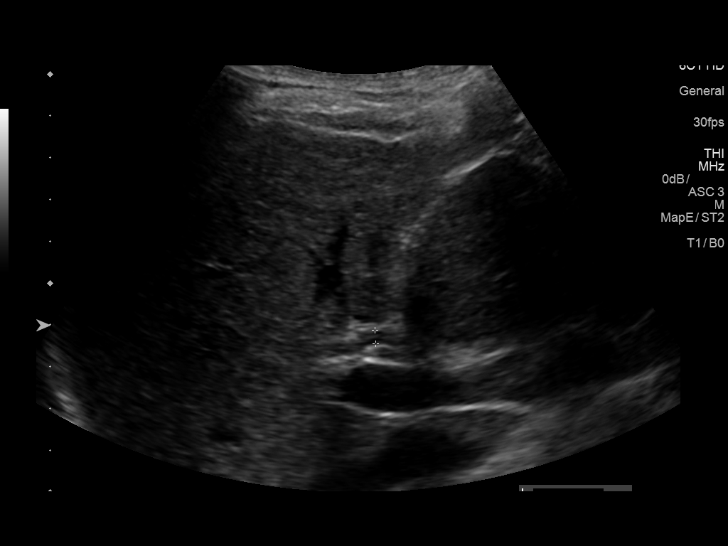
[im 19/41]
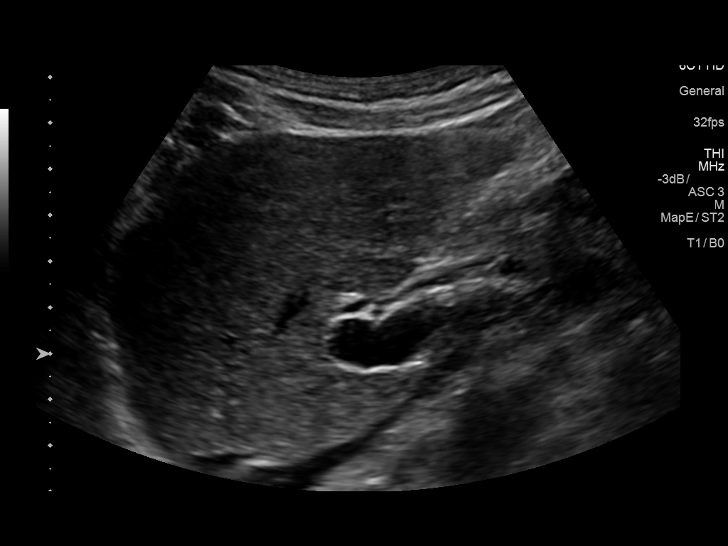
[im 22/41]
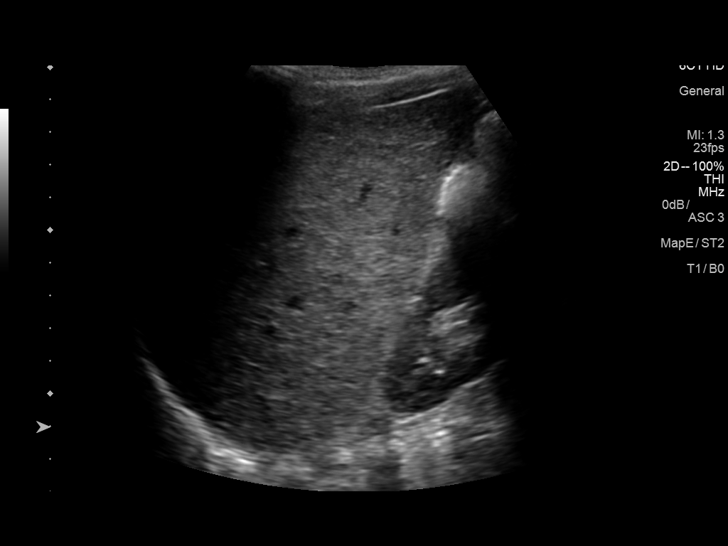
[im 26/41]
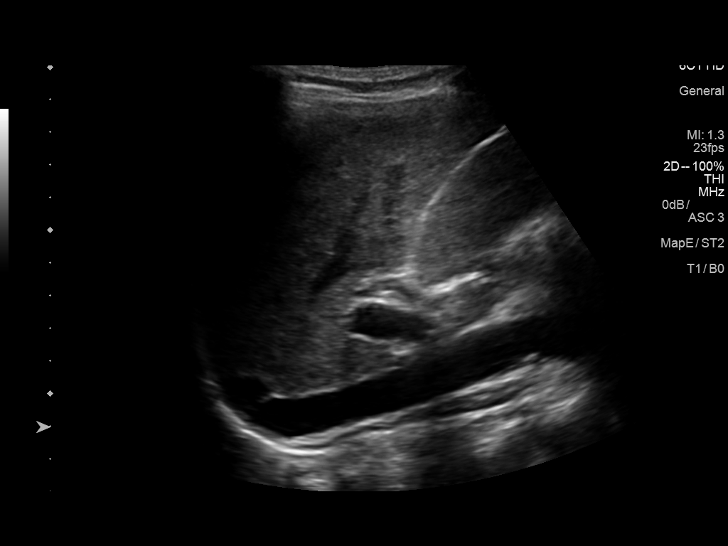
[im 27/41]
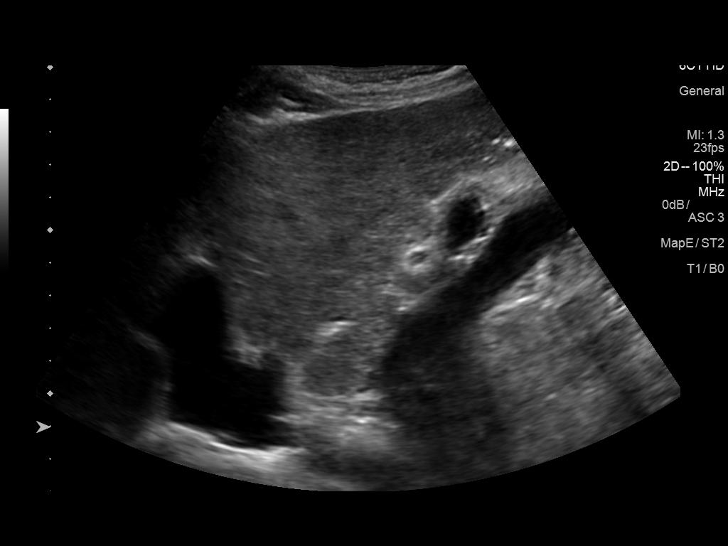
[im 31/41]
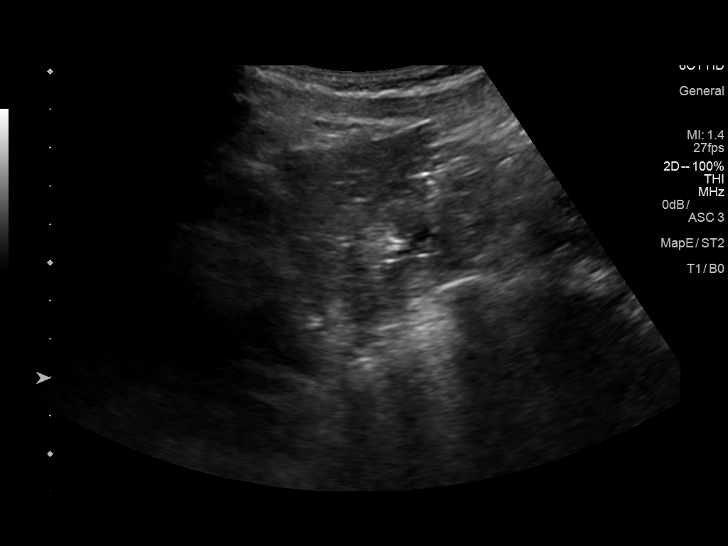
[im 34/41]
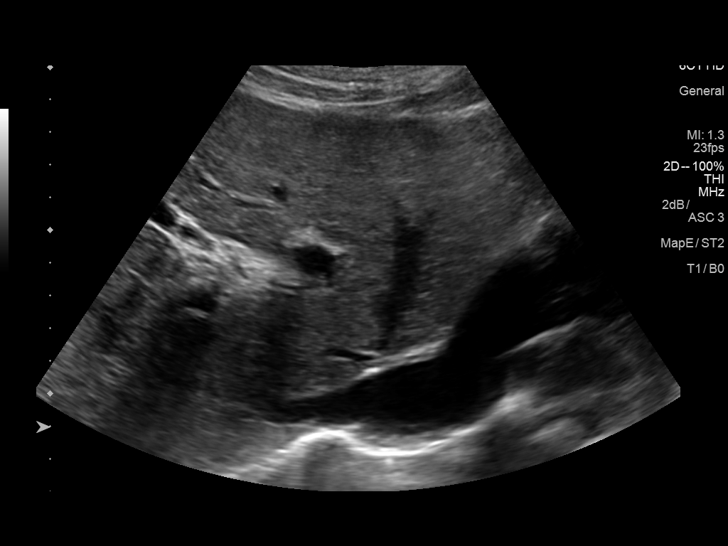
[im 37/41]
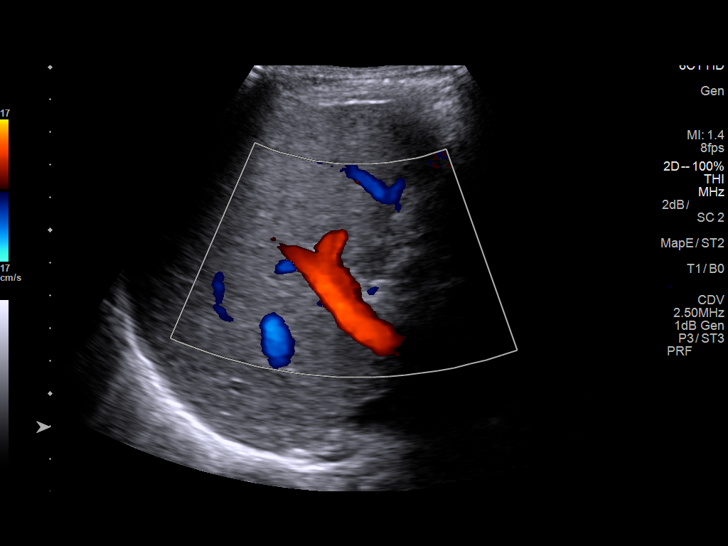
[im 41/41]
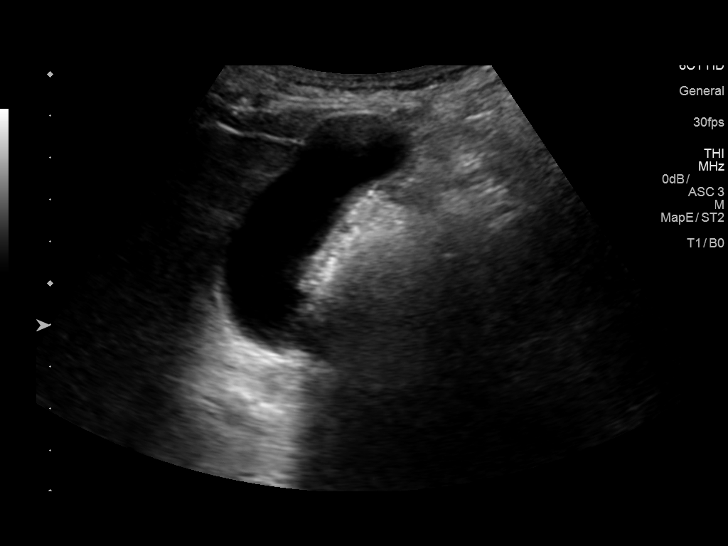

[14 of 25 positions shown; findings below may reference images not displayed]

FINDINGS: Gallbladder:

No gallstones. Gallbladder wall is slightly prominent at 2.9 mm.
Although cholecystitis cannot be completely excluded, may be from
hypoproteinemia. Negative Murphy sign.

Common bile duct:

Diameter: 3.2 mm

Liver:

Increase echogenicity of the liver noted consistent fatty
infiltration and/or hepatocellular disease. Slightly irregular
contour consistent patient's known cirrhosis. No focal hepatic
abnormality identified.
IMPRESSION: 1. Liver has a hyperechoic echotexture consistent fatty infiltration
and/or pedis 80 disease. Nodular contour noted consistent with known
cirrhosis. No focal hepatic abnormality identified.

2. No gallstones or biliary distention. Minimal prominence of the
gallbladder wall, most likely from hypoproteinemia .

## 2018-07-13 ENCOUNTER — Other Ambulatory Visit: Payer: Self-pay | Admitting: Gastroenterology

## 2018-07-13 DIAGNOSIS — B182 Chronic viral hepatitis C: Secondary | ICD-10-CM

## 2018-07-13 DIAGNOSIS — K7400 Hepatic fibrosis, unspecified: Secondary | ICD-10-CM

## 2018-07-20 ENCOUNTER — Ambulatory Visit
Admission: RE | Admit: 2018-07-20 | Discharge: 2018-07-20 | Disposition: A | Discharge status: Home / Self Care | Payer: BLUE CROSS/BLUE SHIELD | Source: Ambulatory Visit | Attending: Gastroenterology | Admitting: Gastroenterology

## 2018-07-20 ENCOUNTER — Other Ambulatory Visit: Payer: BLUE CROSS/BLUE SHIELD

## 2018-07-20 DIAGNOSIS — K7400 Hepatic fibrosis, unspecified: Secondary | ICD-10-CM

## 2018-07-20 DIAGNOSIS — B182 Chronic viral hepatitis C: Secondary | ICD-10-CM

## 2019-12-09 IMAGING — US US ABDOMEN COMPLETE
1 series · 14 of 25 positions shown · non-contrast
Comparison: Ultrasound, 09/27/2016

CLINICAL DATA: Attic fibrosis.  Attic C carrier.

EXAM:
ABDOMEN ULTRASOUND COMPLETE

[Series 1: us abdomen complete · 0.20mm/px · 14 of 83 slices shown]
[im 1/83]
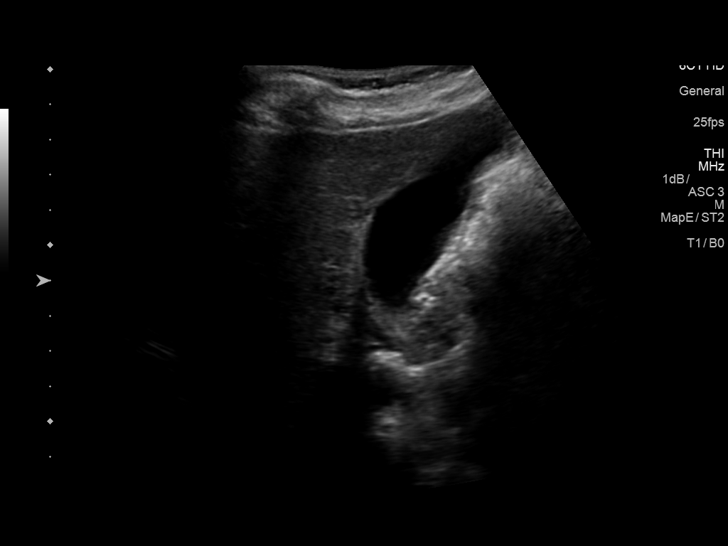
[im 7/83]
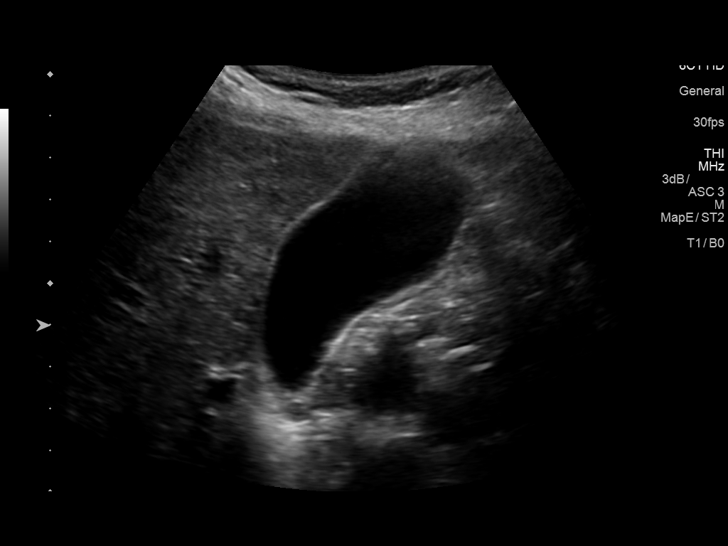
[im 14/83]
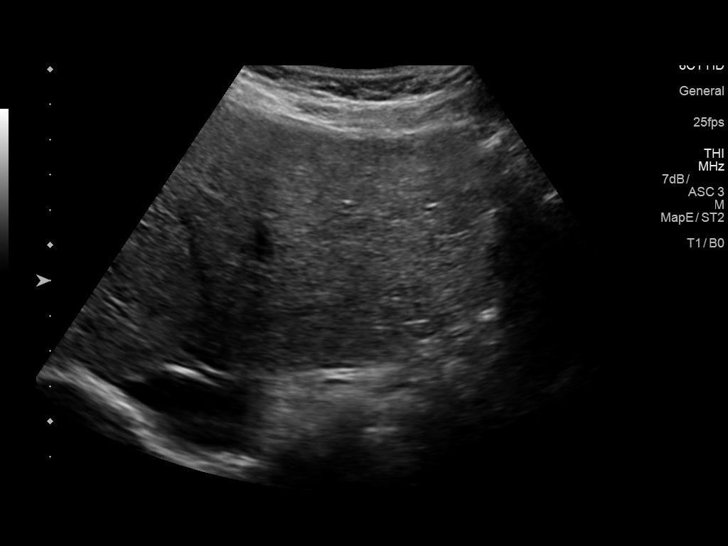
[im 21/83]
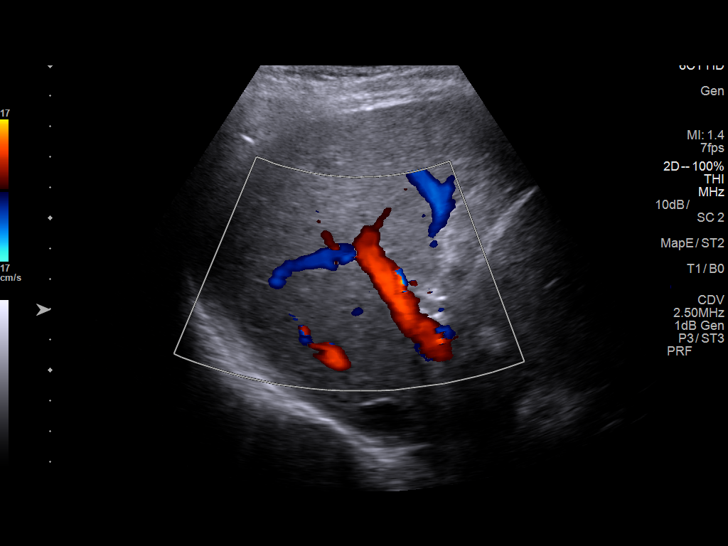
[im 28/83]
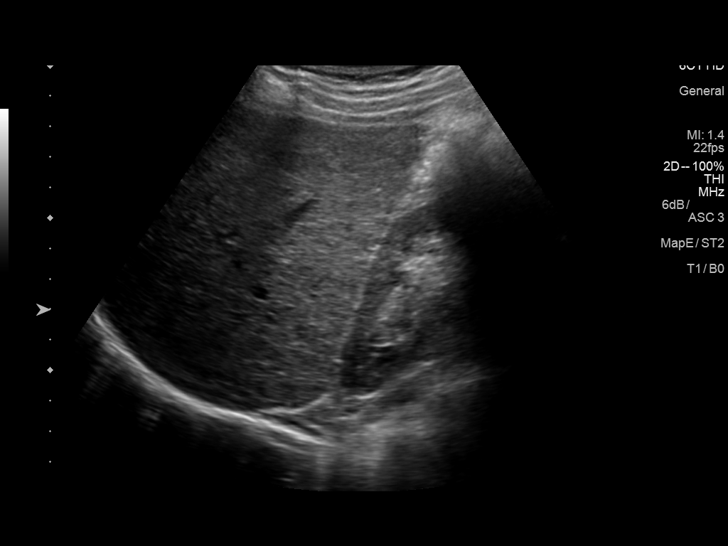
[im 31/83]
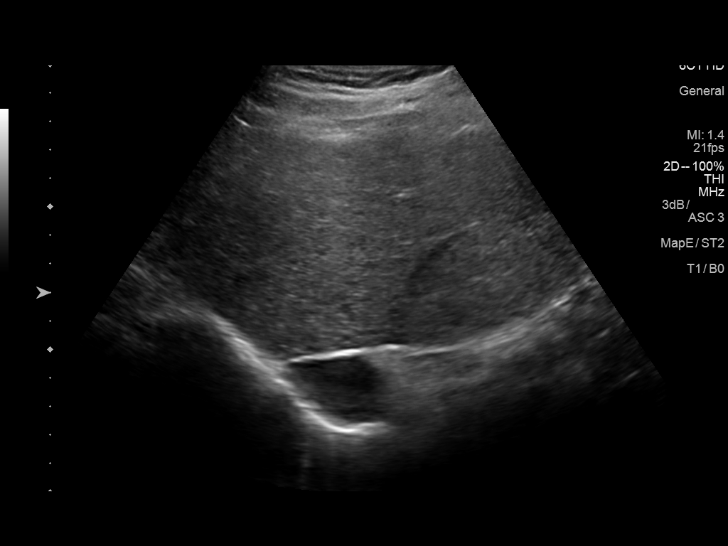
[im 38/83]
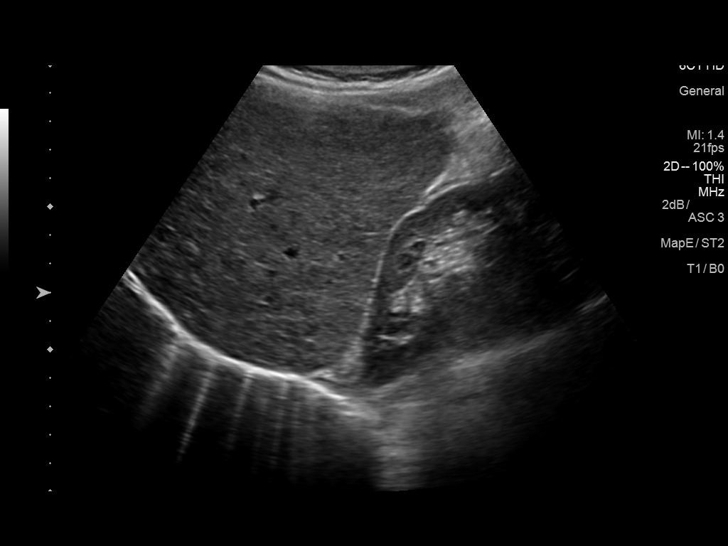
[im 45/83]
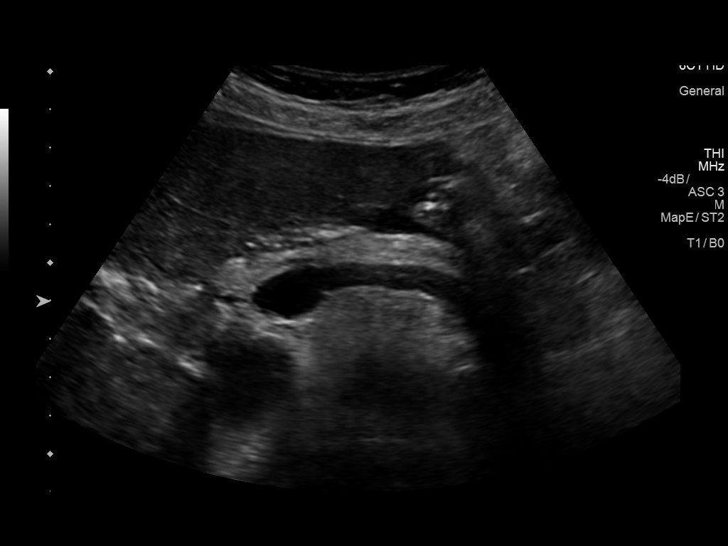
[im 52/83]
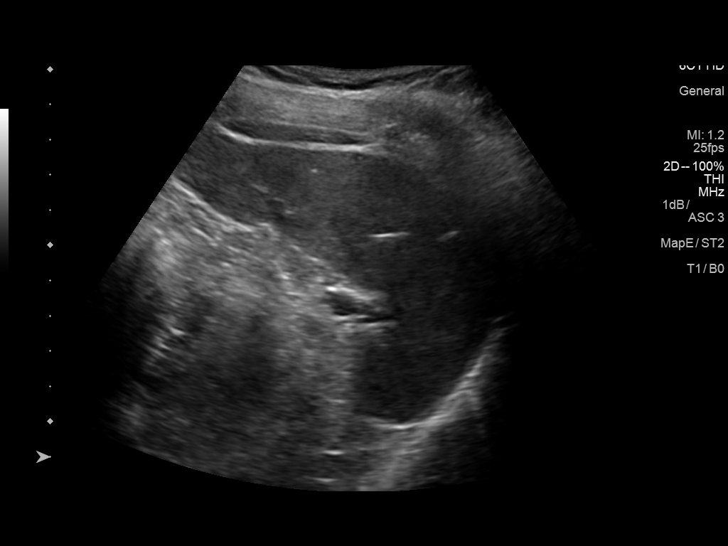
[im 55/83]
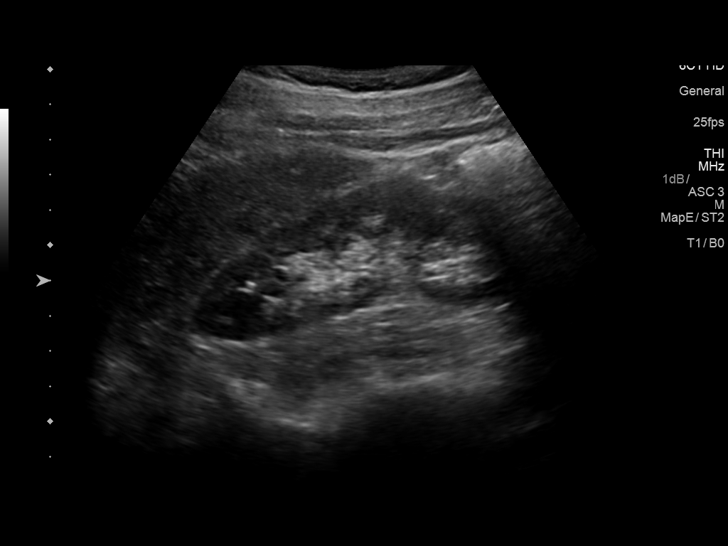
[im 62/83]
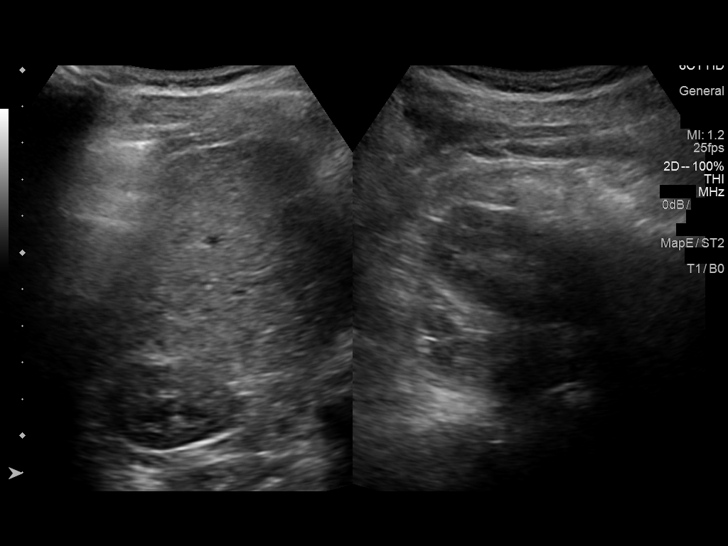
[im 69/83]
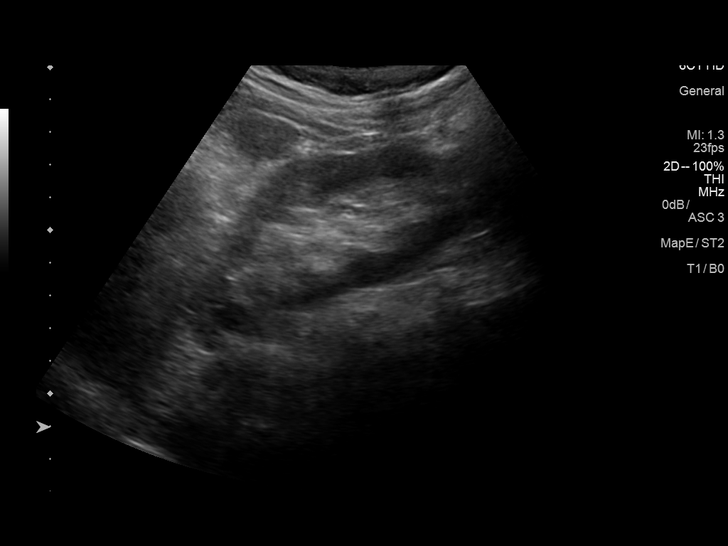
[im 76/83]
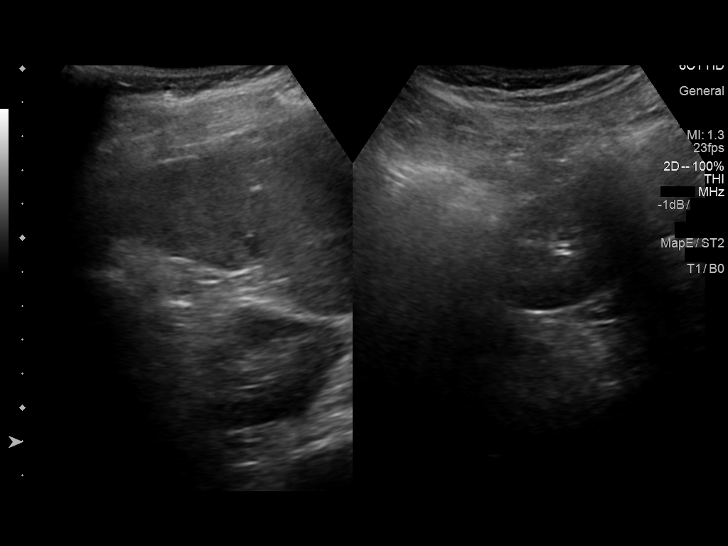
[im 83/83]
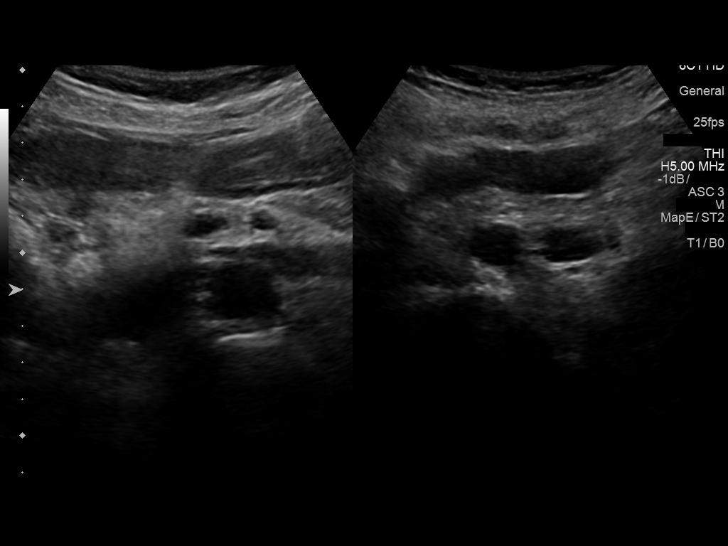

[14 of 25 positions shown; findings below may reference images not displayed]

FINDINGS: Gallbladder: No gallstones or wall thickening visualized. No
sonographic Murphy sign noted by sonographer.

Common bile duct: Diameter: 3 mm

Liver: Normal parenchymal echogenicity. No mass or focal lesion.
Normal in overall size. Portal vein is patent on color Doppler
imaging with normal direction of blood flow towards the liver.

IVC: No abnormality visualized.

Pancreas: Visualized portion unremarkable.

Spleen: Normal in size. Small echogenic foci suggest calcifications
from healed granuloma. No masses.

Right Kidney: Length: 9.5 cm. Echogenicity within normal limits. No
mass or hydronephrosis visualized.

Left Kidney: Length: 10.5 cm. Echogenicity within normal limits. No
mass or hydronephrosis visualized.

Abdominal aorta: No aneurysm visualized.

Other findings: None.
IMPRESSION: 1. Unremarkable abdominal ultrasound. Normal sonographic appearance
of the liver.
